# Patient Record
Sex: Male | Born: 1935 | Race: White | Hispanic: No | Marital: Married | Smoking: Former smoker
Health system: Southern US, Community
[De-identification: ages and names within clinical notes are randomized; demographics above are authoritative.]

---

## 2000-12-28 ENCOUNTER — Ambulatory Visit: Admission: RE | Admit: 2000-12-28 | Discharge: 2000-12-28 | Payer: Self-pay | Admitting: Internal Medicine

## 2000-12-28 ENCOUNTER — Encounter (INDEPENDENT_AMBULATORY_CARE_PROVIDER_SITE_OTHER): Payer: Self-pay | Admitting: Specialist

## 2005-06-22 ENCOUNTER — Ambulatory Visit: Payer: Self-pay | Admitting: Cardiology

## 2005-06-22 ENCOUNTER — Ambulatory Visit (HOSPITAL_COMMUNITY): Admission: RE | Admit: 2005-06-22 | Discharge: 2005-06-22 | Payer: Self-pay | Admitting: Cardiology

## 2005-08-30 ENCOUNTER — Ambulatory Visit: Payer: Self-pay | Admitting: Cardiology

## 2005-09-02 ENCOUNTER — Ambulatory Visit: Payer: Self-pay | Admitting: Internal Medicine

## 2006-08-28 ENCOUNTER — Ambulatory Visit: Payer: Self-pay | Admitting: Cardiology

## 2006-09-07 ENCOUNTER — Ambulatory Visit: Payer: Self-pay | Admitting: Cardiology

## 2006-09-07 ENCOUNTER — Ambulatory Visit: Payer: Self-pay

## 2006-09-07 ENCOUNTER — Encounter: Payer: Self-pay | Admitting: Cardiology

## 2006-09-13 ENCOUNTER — Ambulatory Visit: Payer: Self-pay | Admitting: Cardiology

## 2006-10-14 ENCOUNTER — Ambulatory Visit: Payer: Self-pay | Admitting: Cardiology

## 2007-01-02 ENCOUNTER — Ambulatory Visit: Payer: Self-pay | Admitting: Internal Medicine

## 2007-01-31 ENCOUNTER — Ambulatory Visit: Payer: Self-pay | Admitting: Cardiology

## 2007-03-28 ENCOUNTER — Ambulatory Visit: Payer: Self-pay | Admitting: Cardiology

## 2007-05-23 ENCOUNTER — Ambulatory Visit: Payer: Self-pay | Admitting: Cardiology

## 2007-07-18 ENCOUNTER — Ambulatory Visit: Payer: Self-pay | Admitting: Cardiology

## 2007-09-13 ENCOUNTER — Ambulatory Visit: Payer: Self-pay | Admitting: Cardiology

## 2007-11-07 ENCOUNTER — Ambulatory Visit: Payer: Self-pay | Admitting: Cardiology

## 2007-12-06 ENCOUNTER — Ambulatory Visit: Payer: Self-pay | Admitting: Cardiology

## 2008-04-25 ENCOUNTER — Ambulatory Visit: Payer: Self-pay | Admitting: Cardiology

## 2008-06-11 ENCOUNTER — Ambulatory Visit: Payer: Self-pay

## 2008-10-31 DIAGNOSIS — I10 Essential (primary) hypertension: Secondary | ICD-10-CM | POA: Insufficient documentation

## 2008-10-31 DIAGNOSIS — D869 Sarcoidosis, unspecified: Secondary | ICD-10-CM

## 2008-11-03 ENCOUNTER — Ambulatory Visit: Payer: Self-pay | Admitting: Internal Medicine

## 2008-11-03 DIAGNOSIS — R05 Cough: Secondary | ICD-10-CM

## 2008-12-15 ENCOUNTER — Ambulatory Visit: Payer: Self-pay | Admitting: Internal Medicine

## 2009-03-13 ENCOUNTER — Encounter (INDEPENDENT_AMBULATORY_CARE_PROVIDER_SITE_OTHER): Payer: Self-pay

## 2009-03-20 ENCOUNTER — Telehealth: Payer: Self-pay | Admitting: Cardiology

## 2009-04-13 DIAGNOSIS — R609 Edema, unspecified: Secondary | ICD-10-CM

## 2009-04-13 DIAGNOSIS — Z950 Presence of cardiac pacemaker: Secondary | ICD-10-CM | POA: Insufficient documentation

## 2009-04-14 ENCOUNTER — Ambulatory Visit: Payer: Self-pay | Admitting: Cardiology

## 2009-05-25 ENCOUNTER — Telehealth: Payer: Self-pay | Admitting: Cardiology

## 2009-12-23 ENCOUNTER — Ambulatory Visit: Payer: Self-pay | Admitting: Cardiology

## 2010-04-06 ENCOUNTER — Ambulatory Visit: Payer: Self-pay | Admitting: Cardiology

## 2010-06-17 ENCOUNTER — Ambulatory Visit: Payer: Self-pay | Admitting: Cardiology

## 2010-09-24 ENCOUNTER — Encounter (INDEPENDENT_AMBULATORY_CARE_PROVIDER_SITE_OTHER): Payer: Self-pay | Admitting: *Deleted

## 2010-11-01 ENCOUNTER — Encounter: Payer: Self-pay | Admitting: Cardiology

## 2010-11-01 ENCOUNTER — Ambulatory Visit: Payer: Self-pay | Admitting: Cardiology

## 2010-11-01 DIAGNOSIS — R0602 Shortness of breath: Secondary | ICD-10-CM

## 2010-11-03 ENCOUNTER — Ambulatory Visit: Payer: Self-pay | Admitting: Internal Medicine

## 2010-11-03 DIAGNOSIS — J449 Chronic obstructive pulmonary disease, unspecified: Secondary | ICD-10-CM

## 2010-11-09 ENCOUNTER — Telehealth (INDEPENDENT_AMBULATORY_CARE_PROVIDER_SITE_OTHER): Payer: Self-pay | Admitting: *Deleted

## 2010-11-10 ENCOUNTER — Ambulatory Visit: Payer: Self-pay

## 2010-11-10 ENCOUNTER — Encounter (HOSPITAL_COMMUNITY)
Admission: RE | Admit: 2010-11-10 | Discharge: 2010-12-28 | Payer: Self-pay | Source: Home / Self Care | Attending: Cardiology | Admitting: Cardiology

## 2010-11-10 ENCOUNTER — Encounter: Payer: Self-pay | Admitting: *Deleted

## 2010-11-11 ENCOUNTER — Encounter: Payer: Self-pay | Admitting: Cardiology

## 2010-12-28 NOTE — Cardiovascular Report (Signed)
Summary: TTM   TTM   Imported By: Roderic Ovens 06/11/2010 15:45:21  _____________________________________________________________________  External Attachment:    Type:   Image     Comment:   External Document

## 2010-12-28 NOTE — Assessment & Plan Note (Signed)
Summary: 6 month rov need device check also/sl   Visit Type:  Follow-up Primary Provider:  Dr. Shary Decamp  CC:  no complaints.  History of Present Illness: Patient return for followup management of his pacemaker. He had a St. Jude pacemaker implanted for AV block and had a generator change in 2006. He has been coming in for pacemaker checks every 6 months rather than following by phone. He says been doing very well with no symptoms of chest pain and shortness of breath palpitations or dizziness.  His other problem has been lower extremity edema related to venous insufficiency and possible mild diastolic heart failure. He is on Diovan with hydrochlorothiazide and this is The fluid under good control.  He is from aspirin with his primary care physician is Dr. Shary Decamp.  Current Medications (verified): 1)  Multivitamins  Tabs (Multiple Vitamin) .... Take 1 Tablet By Mouth Once A Day 2)  Aspirin Low Dose 81 Mg Tabs (Aspirin) .... Take 1 Tablet By Mouth Once A Day 3)  Aleve 220 Mg Caps (Naproxen Sodium) .... As Needed 4)  Diovan Hct 160-25 Mg Tabs (Valsartan-Hydrochlorothiazide) .Marland Kitchen.. 1 Tab Once Daily  Allergies (verified): No Known Drug Allergies  Past History:  Past Medical History: Reviewed history from 04/14/2009 and no changes required. HYPERTENSION (ICD-401.9)     - Ace cough suspected 11/03/08 d/c until December 15, 2008 > rechallenge SARCOIDOSIS (ICD-135)    - dx [facility] in 1975    - rebx 12/28/00 FOB (Wert) pos NCG    - steroid rx 2/02 > 8/02    - PFT's December 15, 2008 FEV1 1.8 (60%) ratio 62 DLCO 64 % no sign response to B2 Chronic sinusitis     - Positive right max sinusitis 12/28/00 Moderate obesity     - Target wt  <  213   for BMI < 30  1. Status post St. Jude Identity DDD pacemaker generator change in    2006 for underlying complete AV block. 2. Lower extremity edema, probably related to venous insufficiency     aggravated by Norvasc, possibly related to mild diastolic  dysfunction. 3. Hypertension. 4. Symptoms suggestive of sleep apnea.      Review of Systems       ROS is negative except as outlined in HPI.   Vital Signs:  Patient profile:   77 year old male Height:      72 inches Weight:      211 pounds Pulse rate:   60 / minute BP sitting:   108 / 61  (left arm) Cuff size:   large  Vitals Entered By: Burnett Kanaris, CNA (December 23, 2009 3:20 PM)  Physical Exam  Additional Exam:  Gen. Well-nourished, in no distress   Neck: No JVD, thyroid not enlarged, no carotid bruits Lungs: No tachypnea, clear without rales, rhonchi or wheezes Cardiovascular: Rhythm regular, PMI not displaced,  heart sounds  normal, no murmurs or gallops, no peripheral edema, pulses normal in all 4 extremities. Abdomen: BS normal, abdomen soft and non-tender without masses or organomegaly, no hepatosplenomegaly. MS: No deformities, no cyanosis or clubbing   Neuro:  No focal sns   Skin:  no lesions    PPM Specifications Following MD:  Everardo Beals. Juanda Chance, MD     PPM Vendor:  St Jude     PPM Model Number:  [id number]     PPM Serial Number:  [id number] DOI:  06/20/2005     PPM Implanting MD:  Everardo Beals. Juanda Chance, MD  Lead 1    Location: RA     DOI: 09/12/1990     Model #: 1028T     Serial #: [id number]     Status: active Lead 2    Location: RV     DOI: 09/12/1990     Model #: 1216T     Serial #: [id number]     Status: active   Indications:  CHB   PPM Follow Up Remote Check?  No Battery Voltage:  2.78 V     Battery Est. Longevity:  8.5-10 YEARS     Pacer Dependent:  Yes       PPM Device Measurements Atrium  Amplitude: 2.6 mV, Impedance: 460 ohms, Threshold: 1.25 V at 0.5 msec Right Ventricle  Amplitude: PACED AT 30 mV, Impedance: 418 ohms, Threshold: 0.5 V at 0.5 msec  Episodes MS Episodes:  3     Percent Mode Switch:  <1%     Ventricular High Rate:  NOT AVAILABLE     Atrial Pacing:  6.8%     Ventricular Pacing:  97%  Parameters Mode:  DDD     Lower Rate Limit:  55     Upper Rate  Limit:  120 Paced AV Delay:  170     Sensed AV Delay:  150 Next Cardiology Appt Due:  05/28/2010 Tech Comments:  Normal device function.  No changes made today.  All mode switch episodes less than 1 minute.  ROV 6 months. Gypsy Balsam RN BSN  December 23, 2009 3:14 PM   Impression & Recommendations:  Problem # 1:  PACEMAKER (ICD-V45.Marland Kitchen01) We interrogated his pacemaker today and he has good pacer function. He is pacing the ventricle all the time in the atrium a minority of the time. He is pacer dependent.  We will plan to have him come in for a six-month pacer check. Will arrange his yearly followup visit with Dr. Graciela Husbands in the [facility] clinic. He is agreeable to be followed with phone checks and we probably should transition into that over the next year, especially since he is pacer dependent.  Problem # 2:  EDEMA (ICD-782.3) He has a history of edema probably mostly related to venous insufficiency. This appears to be well controlled on hydrochlorothiazide and his blood pressure medications.  Patient Instructions: 1)  Your physician wants you to follow-up in: 1 year with Dr. Graciela Husbands in [place].  You will receive a reminder letter in the mail two months in advance. If you don't receive a letter, please call our office to schedule the follow-up appointment. 2)  You need a device check in 6 months with Amber/Paula

## 2010-12-28 NOTE — Assessment & Plan Note (Signed)
Summary: per check out   Visit Type:  Follow-up Primary Provider:  Dr. Shary Decamp  CC:  ROV; No Complaints.  History of Present Illness: Patient return for followup management of his pacemaker. He had a St. Jude pacemaker implanted for AV block and had a generator change in 2006. He has been coming in for pacemaker checks every 6 months rather than following by phone. He says been doing very well with no symptoms of chest pain palpitations. However his wife says that he gives out very easily and has shortness of breath with minimal exertion.   His other problem has been lower extremity edema related to venous insufficiency and possible mild diastolic heart failure. He is on Diovan with hydrochlorothiazide and this is The fluid under good control.  He is from aspirin with his primary care physician is Dr. Shary Decamp.  Current Medications (verified): 1)  Multivitamins  Tabs (Multiple Vitamin) .... Take 1 Tablet By Mouth Once A Day 2)  Aspirin Low Dose 81 Mg Tabs (Aspirin) .... Take 1 Tablet By Mouth Once A Day 3)  Aleve 220 Mg Caps (Naproxen Sodium) .... As Needed 4)  Diovan 80 Mg Tabs (Valsartan) .... One By Mouth Daily  Allergies (verified): No Known Drug Allergies  Past History:  Past Surgical History: Last updated: 04/10/2009 implanted September 12, 1990), and implantation of a new  Identity ADX DDDR pacemaker (model number [id number], serial number [id number]).  Family History: Last updated: 04/10/2009 neg asthma or resp/atopic dz Mother: 85 is alive and in good health Father: died of pulmonary fibrosis Siblings: sister alive and well  Social History: Last updated: 04/10/2009 Quit smoking 1975 Married  Retired  Lobbyist Alcohol Use - yes,  two cocktails drinks daily  Risk Factors: Smoking Status: quit (11/03/2008)  Past Medical History: Reviewed history from 04/14/2009 and no changes required. HYPERTENSION (ICD-401.9)     - Ace cough suspected 11/03/08 d/c until December 15, 2008 > rechallenge SARCOIDOSIS (ICD-135)    - dx [facility] in 1975    - rebx 12/28/00 FOB (Wert) pos NCG    - steroid rx 2/02 > 8/02    - PFT's December 15, 2008 FEV1 1.8 (60%) ratio 62 DLCO 64 % no sign response to B2 Chronic sinusitis     - Positive right max sinusitis 12/28/00 Moderate obesity     - Target wt  <  213   for BMI < 30  1. Status post St. Jude Identity DDD pacemaker generator change in    2006 for underlying complete AV block. 2. Lower extremity edema, probably related to venous insufficiency     aggravated by Norvasc, possibly related to mild diastolic     dysfunction. 3. Hypertension. 4. Symptoms suggestive of sleep apnea.      Review of Systems       ROS is negative except as outlined in HPI.   Vital Signs:  Patient profile:   77 year old male Height:      72 inches Weight:      209 pounds BMI:     28.45 Pulse rate:   72 / minute BP sitting:   128 / 62  (left arm) Cuff size:   regular  Vitals Entered By: Stanton Kidney, EMT-P (November 01, 2010 10:22 AM)  Physical Exam  Additional Exam:  Gen. Well-nourished, in no distress   Neck: No JVD, thyroid not enlarged, no carotid bruits Lungs: No tachypnea, clear without rales, rhonchi or wheezes Cardiovascular: Rhythm regular, PMI not displaced,  heart sounds  normal, no murmurs or gallops, no peripheral edema, pulses normal in all 4 extremities. Abdomen: BS normal, abdomen soft and non-tender without masses or organomegaly, no hepatosplenomegaly. MS: No deformities, no cyanosis or clubbing   Neuro:  No focal sns   Skin:  no lesions    PPM Specifications Following MD:  Everardo Beals. Juanda Chance, MD     PPM Vendor:  St Jude     PPM Model Number:  [id number]     PPM Serial Number:  [id number] PPM DOI:  06/20/2005     PPM Implanting MD:  Everardo Beals. Juanda Chance, MD  Lead 1    Location: RA     DOI: 09/12/1990     Model #: 1028T     Serial #: [id number]     Status: active Lead 2    Location: RV     DOI: 09/12/1990     Model #: 1216T     Serial  #: [id number]     Status: active   Indications:  CHB   PPM Follow Up Remote Check?  No Battery Voltage:  2.78 V     Battery Est. Longevity:  6.25     Pacer Dependent:  Yes       PPM Device Measurements Atrium  Amplitude: 1.4 mV, Impedance: 442 ohms, Threshold: 1.0 V at 0.5 msec Right Ventricle  Impedance: 427 ohms, Threshold: 0.5 V at 0.5 msec  Episodes MS Episodes:  3     Percent Mode Switch:  <1%     Coumadin:  No Atrial Pacing:  7.4%     Ventricular Pacing:  100%  Parameters Mode:  DDD     Lower Rate Limit:  55     Upper Rate Limit:  120 Paced AV Delay:  170     Sensed AV Delay:  150 Next Cardiology Appt Due:  01/17/2011 Tech Comments:  No parameter changes.  Device function normal.  3 mode switch episodes the longest 10 seconds, - coumadin.  TTM's with [employer].  ROV 01/17/11 with Dr. Graciela Husbands in [place]. Altha Harm, LPN  November 01, 2010 10:45 AM   Impression & Recommendations:  Problem # 1:  PACEMAKER, PERMANENT (ICD-V45.01) He has a St. Jude pacemaker for AV block. We interrogated him today and he is pacing the ventricle all the time. He is pacer dependent. His parameters were good.  S. preferred not to check his pacemaker by phone to check him in clinic every 6 months. He is from aspirin. We will plan with Dr. Graciela Husbands in the hospital clinic in 6 months and every 6 months. I discussed the patient with Dr. Graciela Husbands today. Orders: EKG w/ Interpretation (93000)  Problem # 2:  SHORTNESS OF BREATH (ICD-786.05) Sshortness of breath with exertion endocrine whose wife is significantly worse than previously. I'm concerned this could be an ischemic equivalent. His age sex and history of hypertension Coumadin at least moderate risk. In the right eye with a wrist rest Myoview scan. This will also give [place] an assessment of his LV function since he has been in a paced rhythm for some time. Alternative explanations for her dyspnea include his pulmonary problem with sarcoidosis by Dr. Sherene Sires and his age  and conditioning. His updated medication list for this problem includes:    Aspirin Low Dose 81 Mg Tabs (Aspirin) .Marland Kitchen... Take 1 tablet by mouth once a day    Diovan 80 Mg Tabs (Valsartan) ..... One by mouth daily  Orders: EKG w/ Interpretation (93000) Nuclear  Stress Test (Nuc Stress Test)  His updated medication list for this problem includes:    Aspirin Low Dose 81 Mg Tabs (Aspirin) .Marland Kitchen... Take 1 tablet by mouth once a day    Diovan 80 Mg Tabs (Valsartan) ..... One by mouth daily  Problem # 3:  HYPERTENSION (ICD-401.9) This is well controlled on current medications. His updated medication list for this problem includes:    Aspirin Low Dose 81 Mg Tabs (Aspirin) .Marland Kitchen... Take 1 tablet by mouth once a day    Diovan 80 Mg Tabs (Valsartan) ..... One by mouth daily  Orders: EKG w/ Interpretation (93000)  His updated medication list for this problem includes:    Aspirin Low Dose 81 Mg Tabs (Aspirin) .Marland Kitchen... Take 1 tablet by mouth once a day    Diovan 80 Mg Tabs (Valsartan) ..... One by mouth daily  Patient Instructions: 1)  Your physician has requested that you have a Lexiscan myoview.  For further information please visit [email or web address]  Please follow instruction sheet, as given. 2)  Your physician recommends that you continue on your current medications as directed. Please refer to the Current Medication list given to you today. 3)  Your physician wants you to follow-up in: 6 months with Dr. Graciela Husbands in the [facility] office for followup on your pacemaker.  You will receive a reminder letter in the mail two months in advance. If you don't receive a letter, please call our office to schedule the follow-up appointment.

## 2010-12-28 NOTE — Assessment & Plan Note (Signed)
Summary: Pulmonary/ ext f/u ov with walking sats = ok   Primary Provider/Referring Provider:  Dr. Shary Decamp  CC:  Dyspnea and cough- the same.  History of Present Illness: 57 yowm quit smoking around 1975 dx with sarcoid in 1975 and last took prednisone in 2002  November 03, 2008 back to pulmonary clinic with dry cough x 3-4 weeks,  slt more doe, noct sensation of pnds but no real mucus production asssoc with halitosis per wife. stopped ramipril and started diovan and also took round of abx   December 15, 2008 ov off ramapril doing fine in all respects though not aerobically active.> no change rx needed   November 03, 2010 ov  Dyspnea and cough- the same, pt denies it's a problem unless heavy ex like walking behind self propelled mower in the heat.   Pt denies any significant sore throat, dysphagia, itching, sneezing,  nasal congestion or excess secretions,  fever, chills, sweats, unintended wt loss, pleuritic or exertional cp, hempoptysis, change in activity tolerance  orthopnea pnd or leg swelling. Pt also denies any obvious fluctuation in symptoms with weather or environmental change or other alleviating or aggravating factors.       Current Medications (verified): 1)  Multivitamins  Tabs (Multiple Vitamin) .... Take 1 Tablet By Mouth Once A Day 2)  Aspirin Low Dose 81 Mg Tabs (Aspirin) .... Take 1 Tablet By Mouth Once A Day 3)  Aleve 220 Mg Caps (Naproxen Sodium) .... As Needed 4)  Diovan 80 Mg Tabs (Valsartan) .... One By Mouth Daily 5)  Fish Oil 1000 Mg Caps (Omega-3 Fatty Acids) .Marland Kitchen.. 1 Once Daily 6)  Vitamin D 400 Unit Caps (Cholecalciferol) .Marland Kitchen.. 1 Once Daily  Allergies (verified): No Known Drug Allergies  Past History:  Past Medical History: HYPERTENSION (ICD-401.9)     - Ace cough suspected 11/03/08 > resolved  SARCOIDOSIS (ICD-135)    - dx [facility] in 1975    - rebx 12/28/00 FOB (Wert) pos NCG    - steroid rx 2/02 > 8/02    - PFT's December 15, 2008 FEV1 1.8 (60%) ratio 62 DLCO  64 % no sign response to B2 Chronic sinusitis     - Positive right max sinusitis 12/28/00 Moderate obesity     - Target wt  <  213   for BMI < 30  1. Status post St. Jude Identity DDD pacemaker generator change in    2006 for underlying complete AV block. 2. Lower extremity edema, probably related to venous insufficiency     aggravated by Norvasc, possibly related to mild diastolic     dysfunction. 3. Hypertension. 4. Symptoms suggestive of sleep apnea.      Past Surgical History: implanted September 12, 1990), and implantation of a new  Identity ADX DDDR pacemaker (model number [id number], serial number [id number]). Bilateral cataract surgery 2009  Vital Signs:  Patient profile:   77 year old male Weight:      209 pounds O2 Sat:      96 % on Room air Temp:     97.5 degrees F oral Pulse rate:   64 / minute BP sitting:   104 / 60  (left arm)  Vitals Entered By: Vernie Murders (November 03, 2010 4:29 PM)  O2 Flow:  Room air  Serial Vital Signs/Assessments:  Comments: Ambulatory Pulse Oximetry  Resting; HR__78___    02 Sat__95%RA___  Lap1 (185 feet)   HR__96___   02 Sat__92%RA___ Lap2 (185 feet)   HR__100___  02 Sat__89%RA___    Lap3 (185 feet)   HR__102___   02 Sat__90%RA___  _x__Test Completed without Difficulty [name] Jordan CMA  November 03, 2010 4:57 PM  ___Test Stopped due to:   By: [name] Jordan CMA    Physical Exam  Additional Exam:  Pleasant amb wm nad wt 209 November 03, 2010  Neck: No JVD, thyroid not enlarged, no carotid bruits Lungs: No tachypnea, clear without rales, rhonchi or wheezes Cardiovascular: Rhythm regular, PMI not displaced,  heart sounds  normal, no murmurs or gallops, no peripheral edema, pulses normal in all 4 extremities. Abdomen: BS normal, abdomen soft and non-tender without masses or organomegaly, no hepatosplenomegaly. MS: No deformities, no cyanosis or clubbing   Neuro:  No focal sns   Skin:  no lesions    CXR  Procedure date:   11/03/2010  Findings:      Comparison: 12/15/2008.   Findings: Cardiomegaly is present.  Unchanged dual lead left subclavian cardiac pacemaker.  Bilateral  basilar fibrosis consistent with a history of sarcoidosis.  Prominence of the hilar vessels are present bilaterally, chronic.   IMPRESSION: No interval change.  Fibrotic changes at the lung bases compatible with sarcoidosis.  Impression & Recommendations:  Problem # 1:  COPD UNSPECIFIED (ICD-496) GOLD II with ex desat ? from emphysema or ILD related to burned out sarcoidosis.  He really doesn't feel limited enough at this point to be motivated to try empiric rx so ok to return for pft's .  Unlike cardiovascular dz where risk management pays off in terms of progression of dz, there's nothing to offer here other than symptom managment which is a subjective issue - in this case he doesn't want any rx because he tends to minimize his symptoms, much to the chagrin of his wife.   See instructions for specific recommendations   Medications Added to Medication List This Visit: 1)  Fish Oil 1000 Mg Caps (Omega-3 fatty acids) .Marland Kitchen.. 1 once daily 2)  Vitamin D 400 Unit Caps (Cholecalciferol) .Marland Kitchen.. 1 once daily  Other Orders: Pulse Oximetry, Ambulatory (04540) Est. Patient Level IV (98119) T-2 View CXR (71020TC)  Patient Instructions: 1)  Return to office in 3 months, sooner if needed with pfts

## 2010-12-28 NOTE — Procedures (Signed)
Summary: pc2   Current Medications (verified): 1)  Multivitamins  Tabs (Multiple Vitamin) .... Take 1 Tablet By Mouth Once A Day 2)  Aspirin Low Dose 81 Mg Tabs (Aspirin) .... Take 1 Tablet By Mouth Once A Day 3)  Aleve 220 Mg Caps (Naproxen Sodium) .... As Needed 4)  Diovan 80 Mg Tabs (Valsartan) .... One By Mouth Daily  Allergies (verified): No Known Drug Allergies   PPM Specifications Following MD:  Everardo Beals. Juanda Chance, MD     PPM Vendor:  St Jude     PPM Model Number:  [id number]     PPM Serial Number:  [id number] PPM DOI:  06/20/2005     PPM Implanting MD:  Everardo Beals. Juanda Chance, MD  Lead 1    Location: RA     DOI: 09/12/1990     Model #: 1028T     Serial #: [id number]     Status: active Lead 2    Location: RV     DOI: 09/12/1990     Model #: 1216T     Serial #: [id number]     Status: active   Indications:  CHB   PPM Follow Up Remote Check?  No Battery Voltage:  2.78 V     Battery Est. Longevity:  7 years     Pacer Dependent:  Yes       PPM Device Measurements Atrium  Amplitude: 1.9 mV, Impedance: 463 ohms, Threshold: 1.25 V at 0.5 msec Right Ventricle  Impedance: 411 ohms, Threshold: 0.625 V at 0.5 msec  Episodes MS Episodes:  6     Percent Mode Switch:  <1%     Coumadin:  No Atrial Pacing:  5.8%     Ventricular Pacing:  99%  Parameters Mode:  DDD     Lower Rate Limit:  55     Upper Rate Limit:  120 Paced AV Delay:  170     Sensed AV Delay:  150 Next Cardiology Appt Due:  10/28/2010 Tech Comments:  No parameter changes.  6 mode switch episodes the longest 12 minutes, - coumadin.  ROV 5 months with Dr. Juanda Chance. Altha Harm, LPN  June 17, 2010 11:45 AM

## 2010-12-28 NOTE — Cardiovascular Report (Signed)
Summary: Office Visit   Office Visit   Imported By: Roderic Ovens 08/27/2010 12:55:31  _____________________________________________________________________  External Attachment:    Type:   Image     Comment:   External Document

## 2010-12-28 NOTE — Miscellaneous (Signed)
Summary: dx correction  Clinical Lists Changes  Problems: Changed problem from PACEMAKER (ICD-V45.Marland Kitchen01) to PACEMAKER, PERMANENT (ICD-V45.01)  changed the incorrect dx code to correct dx code Genella Mech  September 24, 2010 12:52 PM

## 2010-12-30 NOTE — Cardiovascular Report (Signed)
Summary: Office Visit   Office Visit   Imported By: Roderic Ovens 11/11/2010 11:49:59  _____________________________________________________________________  External Attachment:    Type:   Image     Comment:   External Document

## 2010-12-30 NOTE — Assessment & Plan Note (Signed)
Summary: Cardiology Nuclear Testing  Nuclear Med Background Indications for Stress Test: Evaluation for Ischemia   History: Echo, Pacemaker  History Comments: 89 Pacemaker(CHB). '06 PTVP generator change. '07 Echo EF=NL.   Symptoms: DOE, SOB    Nuclear Pre-Procedure Cardiac Risk Factors: History of Smoking, Hypertension Caffeine/Decaff Intake: None NPO After: 6:00 PM Lungs: Clear IV 0.9% NS with Angio Cath: 22g     IV Site: R Antecubital IV Started by: Bonnita Levan, RN Chest Size (in) 46     Height (in): 72 Weight (lb): 208  Nuclear Med Study 1 or 2 day study:  1 day     Stress Test Type:  Adenosine Reading MD:  Cassell Clement, MD     Referring MD:  Charlies Constable Resting Radionuclide:  Technetium 2m Tetrofosmin     Resting Radionuclide Dose:  11.0 mCi  Stress Radionuclide:  Technetium 75m Tetrofosmin     Stress Radionuclide Dose:  33.0 mCi   Stress Protocol  Dose of Adenosine:  52.9 mg    Stress Test Technologist:  Irean Hong,  RN     Nuclear Technologist:  Doyne Keel, CNMT  Rest Procedure  Myocardial perfusion imaging was performed at rest 45 minutes following the intravenous administration of Technetium 20m Tetrofosmin.  Stress Procedure  The patient received IV adenosine at 140 mcg/kg/min for 4 minutes. The EKG was nondiagnostic due to baseline V-Pacing. Technetium 68m Tetrofosmin was injected at the 2 minute mark and quantitative spect images were obtained after a 45 minute delay.  QPS Raw Data Images:  Normal; no motion artifact; normal heart/lung ratio. Stress Images:  Normal homogeneous uptake in all areas of the myocardium. Rest Images:  Normal homogeneous uptake in all areas of the myocardium. Subtraction (SDS):  No evidence of ischemia. Transient Ischemic Dilatation:  0.99  (Normal <1.22)  Lung/Heart Ratio:  0.30  (Normal <0.45)  Quantitative Gated Spect Images QGS EDV:  120 ml QGS ESV:  55 ml QGS EF:  55 % QGS cine images:  No wall motion  abnormalities.  Findings Normal nuclear study      Overall Impression  Exercise Capacity: Adenosine study with no exercise. BP Response: Normal blood pressure response. Clinical Symptoms: No chest pain ECG Impression: V- paced  Overall Impression: Normal stress nuclear study.  Appended Document: Cardiology Nuclear Testing The pt is aware of his results.

## 2010-12-30 NOTE — Progress Notes (Signed)
Summary: nuc pre procedure  Phone Note Outgoing Call Call back at Home Phone [phone]   Call placed by: Cathlyn Parsons RN,  November 09, 2010 4:24 PM Call placed to: Patient Summary of Call: Reviewed information on Myoview Information Sheet (see scanned document for further details).  Spoke with patients wife.      Nuclear Med Background Indications for Stress Test: Evaluation for Ischemia   History: Echo  History Comments: 89 Pacemaker and generator change 06 07 Echo EF=nl   Symptoms: SOB    Nuclear Pre-Procedure Cardiac Risk Factors: History of Smoking, Hypertension Height (in): 72

## 2011-04-12 NOTE — Assessment & Plan Note (Signed)
[facility]                            CARDIOLOGY OFFICE NOTE   NAME:[name] Jordan, [name] Jordan                         MRN:          [id number]  DATE:12/06/2007                            DOB:          1931/12/16    CORRECTION:  The primary care physician should be Dr. Feliciana Rossetti and  not Dr. Modesto Charon.     [name], MD, [facility]  Electronically Signed    BRB/MedQ  DD: 12/06/2007  DT: 12/06/2007  Job #: [id number]   cc:   Feliciana Rossetti, MD

## 2011-04-12 NOTE — Assessment & Plan Note (Signed)
[name]                            CARDIOLOGY OFFICE NOTE   [name] Jordan DOWNIE                         MRN:          [id number]  DATE:12/06/2007                            DOB:          Aug 26, 1932    PRIMARY CARE PHYSICIAN:  Dr. Feliciana Jordan in [place].   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 Jude Identity generator placed for ERI.  He has done quite well  since that time from a cardiac standpoint.  He has had no recent chest  pain.  He does indicate that he has had increased swelling in his ankles  for the last several months.  He also according to his wife has some  increased shortness of breath with exertion.  He has had no chest pain.   PAST MEDICAL HISTORY:  1. Pulmonary sarcoidosis which has been treated by Dr. Sherene Jordan.  He has      been on steroids in the past but not recently.  2. He also has a history of hypertension.   REVIEW OF SYSTEMS:  His wife says he has frequent snoring and then  periods of apnea at night.  She also indicated that he falls asleep  during the daytime.   CURRENT MEDICATIONS:  1. Aspirin.  2. Norvasc 10 mg.  3. Furosemide.  4. Multivitamin.   PHYSICAL EXAMINATION:  VITAL SIGNS:  Blood pressure 157/84 and the pulse  is 86 and regular.  NECK:  The venous pulsation were questionably visible just at the  clavicle.  CHEST:  Clear without rales or rhonchi.  HEART:  Rhythm is regular.  I heard no murmurs or gallops.  ABDOMEN:  Soft with normal bowel sounds.  There is no  hepatosplenomegaly.  EXTREMITIES:  There was 2 plus edema in the left lower extremity and 1  plus edema in the right lower extremity.  Pedal pulses were equal.   An echocardiogram which was performed in October 2007, showed good LV  function.   IMPRESSION:  1. Status post St. Jude Identity DDD pacemaker generator change in      1991 for  underlying complete AV block.  2. Lower extremity edema, probably related to venous insufficiency      aggravated by Norvasc, possibly related to mild diastolic      dysfunction.  3. Hypertension.  4. Symptoms suggestive of sleep apnea.   RECOMMENDATIONS:  1. Jordan Jordan's edema, I think, is most probably related to venous      insufficiency aggravated by Norvasc.  We will increase his Lasix      from 20 to 40 a day and cut his Norvasc from 10 to 5 a day.  I will      have him follow up with Dr. Shary Decamp and if his blood pressure and      edema are well controlled on this regimen, then we can probably      persist.  If he continues to  have edema or if his blood pressure is      not well controlled, we may have to consider switching or adding      another blood pressure medicine.  2. He has symptoms strongly suggestive of sleep apnea and I think he      may need a sleep study and we will schedule this down in [place]      at his request.  3. I will check his pacemaker today and I will see him back in      followup for his pacemaker in a year.     Jordan Elvera Lennox Juanda Chance, MD, [facility]  Electronically Signed    BRB/MedQ  DD: 12/06/2007  DT: 12/06/2007  Job #: [id number]   cc:   Jordan Rossetti, MD  Jordan Jordan. [name] Sires, MD, FCCP

## 2011-04-15 NOTE — Assessment & Plan Note (Signed)
[facility]                             PULMONARY OFFICE NOTE   NAME:[name] Jordan, [name] Jordan                         MRN:          [id number]  DATE:01/02/2007                            DOB:          08-08-1932    PULMONARY/ACUTE OFFICE EVALUATION   HISTORY:  A 77 year old white male with diagnosis with sarcoid in 1975.  Last required prednisone in 2002, for symptoms of cough and dyspnea. He  comes in with new onset right anterior twinges of chest discomfort  that come and go mostly in the upright position for the last week like  somebody jabbing him.  These are not associated with an increasing  dyspnea over baseline, nor cough, no GI symptoms. He has noticed a few  sweats at night, but not directly related to the discomfort. Denies any  nausea or vomiting, true pleuritic quality of the pain (although it is  made slightly worse with coughing or sneezing if it is active). Note, he  has never had similar symptoms before because of sarcoid. Because he has  abnormal chest x-ray, he wanted to come here for evaluation.   PAST MEDICAL HISTORY:  Status post DDD pacemaker in 1991, changed in  July with a generation change in July of 2006 and also hypertension.   ALLERGIES:  None known.   MEDICATIONS:  1. Multivitamin one daily.  2. Baby aspirin one daily.   Note, he has not tried anything for the discomfort.   SOCIAL HISTORY:  He is a former smoker.   FAMILY HISTORY:  Is negative for respiratory diseases.   REVIEW OF SYSTEMS:  Taken in detail and negative except as outlined  above.   PHYSICAL EXAMINATION:  This is a pleasant, jovial, ambulatory white male  in no acute distress. He has stable vital signs.  HEENT: Is unremarkable. Oropharynx is clear.  LUNGS: Lung fields reveal a few crackles, but bases only. No cough  elicited on inspiratory maneuvers.  HEART: Regular rate and rhythm without murmur, gallop or rub present.  Chest was wall was benign with no  tenderness and abdomen was soft and  benign with no palpable organomegaly or mass or tenderness.  EXTREMITIES: Warm without calf tenderness, cyanosis, clubbing or edema.   Hemoglobin saturation is 96% on room air.   Chest x-ray showed moderate scarring bilaterally with no pleural  effusion or abnormality in the right base.   IMPRESSION:  Atypical chest discomfort that is vaguely pleuritic. New  in onset over the last week, probably musculoskeletal. He has not yet  tried even an Advil for this. I have recommended that he do so. I also  gave him the pattern of discomfort to look for that would indicate a  significant pulmonary, cardiac or hepatic etiology for the pain. I also  asked him to be on the look out for development of a rash over the same  area.   For now, I simply recommended Advil and reassurance, but would recommend  a CT scan of the chest should the discomfort worsen or be associated  with significant pulmonary symptoms.  In terms of his sarcoid, he continues to have minimum crackles on  examination with no limiting dyspnea or chronic cough and I do not  believe has any active disease at present.     Charlaine Dalton. Sherene Sires, MD, [facility]  Electronically Signed    MBW/MedQ  DD: 01/02/2007  DT: 01/02/2007  Job #: [id number]   cc:   Feliciana Rossetti, MD

## 2011-04-15 NOTE — Assessment & Plan Note (Signed)
[facility]                              CARDIOLOGY OFFICE NOTE   NAME:[name] Jordan, [name] Jordan                         MRN:          [id number]  DATE:08/28/2006                            DOB:          [birth year]    PRIMARY CARE PHYSICIAN:  Dr. Arlan Organ.   PULMONOLOGIST:  Sandrea Hughs, MD, [facility]   CLINICAL HISTORY:  [name] Jordan is 77 years old and in 1991 had a pacemaker  implanted for a complete heart block and in July 2006 had a new generator  implanted for ERI.  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  He has done quite  well since that time, has had no recent chest pain, shortness of breath or  palpitations.   PAST MEDICAL HISTORY:  Significant for pulmonary sarcoidosis, was treated by  Dr. Sherene Sires.  He had been on steroids in the past but none recently.  The main  manifestation of sarcoid was interstitial fibrosis, but Dr. Sherene Sires said in his  last note that this was somewhat atypical for sarcoidosis.   CURRENT MEDICATIONS:  Multivitamins and a baby aspirin.   EXAMINATION:  The blood pressure was 178/78 and the pulse 99 and regular.  There was no venous distention.  Carotid pulses were full without bruits.  CHEST:  Was clear.  CARDIAC:  Rhythm was regular.  Heard no murmurs or gallops.  ABDOMEN:  Was soft without organomegaly.  Peripheral pulses were full and there is no peripheral edema.   An ECG showed atrial tracking with ventricular pacing.   IMPRESSION:  1. Status post DDD pacemaker in 1991 for complete atrioventricular block      with generator change July 2006 [address] Jude Identity).  2. History of pulmonary sarcoidosis, now quiescent.  3. Hypertension.   RECOMMENDATIONS:  We will plan to interrogate [name] Jordan's pacemaker and check  his thresholds today.  His blood pressure is up and we will do a repeat  check today.  I am going to have him come back in 2 weeks for an echo to  make sure there is no sarcoidosis involvement of the heart in view that  he  had atrioventricular (AV) block in the past and a history of pulmonary  sarcoidosis.  We will get a repeat blood pressure at that time and then  decide if he needs  treatment for his blood pressure and then arrange followup with Dr. Martha Clan.  I will plan to see him back for his heart in a year.            ______________________________  Everardo Beals. Juanda Chance, MD, [facility]     BRB/MedQ  DD:  08/28/2006  DT:  08/28/2006  Job #:  [id number]   cc:   Charlaine Dalton. Sherene Sires, MD, [facility]  Arlan Organ MD

## 2011-04-15 NOTE — H&P (Signed)
[name] Jordan, [name] NO.:  [id number]   MEDICAL RECORD NO.:  [id number]          PATIENT TYPE:  OIB   LOCATION:  2899                         FACILITY:  [facility]   PHYSICIAN:  Charlies Constable, M.D. [facility] DATE OF BIRTH:  [birth year]   DATE OF ADMISSION:  06/22/2005  DATE OF DISCHARGE:                                HISTORY & PHYSICAL   ELECTROPHYSIOLOGY:  Dr. Charlies Constable   CARDIOLOGIST:  Dr. Sherlyn Lick, [facility], [place], [place].   PRIMARY CAREGIVER:  Dr. Shary Decamp   ALLERGIES:  No known drug allergies.   Mr. [name] Jordan is a 77 year old male with a history of syncope and complete heart  block with subsequent pacer implantation September 10, 1990. He has no other  cardiac history. His only medication, for example, is aspirin 81 mg daily.  He has biopsy-proven pulmonary sarcoidosis. He has marked snoring and  respiratory pauses when he is asleep per the patient's wife - this has not  yet been assessed, and his latest pacer check at Dr. Carole Civil office about 1  month ago showed that the pacemaker was at elective replacement indicator.  The patient presents today for a generator change. The patient has recently  had treatment for exacerbation of chronic bronchitis with steroids and  antibiotics. Medications include aspirin 81 mg daily and multivitamins. He  also was given an inhaler a the time of his diagnosis for bronchitis flare  and he has been tapering prednisone.   SOCIAL HISTORY:  The patient lives in [place] with his wife. He is a  retired Lobbyist. He quit smoking tobacco products 30 years ago.  He does take one to two cocktails drinks daily.   FAMILY HISTORY:  Mother at age 105 is alive and in good health. Father died  of pulmonary fibrosis. One sister alive and well.   REVIEW OF SYSTEMS:  No recent history of fevers or chills. He does have  night sweats. The patient it turns out per the patient's wife has marked  snoring with respiratory  pauses and sometimes she has to shake him awake. No  recent weight loss or gain. No adenopathy. HEENT:  No nasal discharge or  epistaxis. No voice change or hoarseness. No vertigo, no photophobia.  INTEGUMENT:  No rashes or nonhealing ulcerations to lower extremities.  CARDIOPULMONARY:  No chest pain, no shortness of breath. The patient  ambulates without complaining of dyspnea on exertion. No paroxysmal  nocturnal dyspnea. Some occasional edema lower extremities. No presyncope or  syncope since pacemaker insertion in 1991. No claudication. The patient had  recent cough with his flare of bronchitis. No history of palpitations.  UROGENITAL:  The patient had some nocturia, went four times one day last  week in the evening. NEUROPSYCHIATRIC:  No unilateral weakness or numbness,  depression, or anxiety. GASTROINTESTINAL:  The patient has occasional  heartburn for which he takes Rolaids. He has never had a GI bleed. No melena  or abdominal pain. ENDOCRINE:  No history of diabetes or thyroid problems.  MUSCULOSKELETAL:  No joint pains except at the base of the left  thumb. All  other systems are negative.   PHYSICAL EXAMINATION:  VITAL SIGNS:  Temperature 97.4, pulse is 46 and  regular, blood pressure 184/89, respirations are 20, oxygen saturation 97%.  GENERAL:  The patient is alert and oriented x3.  HEENT:  Normocephalic, atraumatic. Eyes:  Pupils equal, round, and reactive  to light. Extraocular movements are intact. Nares without discharge.  NECK:  Supple. No carotid bruits auscultated, no thyromegaly, no jugular  venous distention.  HEART:  Regular rate and rhythm which is slow, no murmur.  LUNGS:  Clear to auscultation and percussion bilaterally.  ABDOMEN:  Soft, nondistended, nontender. Bowel sounds are present. No  hepatosplenomegaly. The abdominal aorta is nonpulsatile.  EXTREMITIES:  Show no evidence of clubbing, cyanosis, or edema. Dorsalis  pedis 3/4 bilaterally and radial pulses  4/4 bilaterally.  MUSCULOSKELETAL:  No joint deformity, effusions, kyphosis, or scoliosis.  NEUROLOGIC:  Alert and oriented x3. No focal deficits noted.   LABORATORY STUDIES:  Consisting of complete blood count, basic metabolic  panel, PT/INR all underway.   ASSESSMENT:  1.  History of syncope with complete heart block status post pacemaker      insertion September 10, 1990. 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 pacer      from the [employer]. This could probably have devolved to [facility]. Jude      because [place]. Jude has a device called Electrical engineer.  2.  Pulmonary sarcoidosis.  3.  Recent bronchitis treated with antibiotics and steroids.  4.  Suspected obstructive sleep apnea.  5.  Blood pressure 184/89 on presentation here. The patient may have trouble      with hypertension that is not being corrected, but this is a one-time      observation. The patient will have a generator change today. Will      discharge the same day on 5 days of oral antibiotics - Keflex - and he      will be instructed not to get his incision wet for the next 7 days.      [name]   GM/MEDQ  D:  06/22/2005  T:  06/22/2005  Job:  [id number]

## 2011-04-15 NOTE — Cardiovascular Report (Signed)
NAME:  CUINN, WESTERHOLD NO.:  [id number]   MEDICAL RECORD NO.:  [id number]          PATIENT TYPE:  OIB   LOCATION:  2899                         FACILITY:  [facility]   PHYSICIAN:  Charlies Constable, M.D. [facility] DATE OF BIRTH:  [birth year]   DATE OF PROCEDURE:  06/22/2005  DATE OF DISCHARGE:                              CARDIAC CATHETERIZATION   INDICATIONS FOR PROCEDURE:  Mr. Mcmanamon is 77 years old and had a Paragon II  pacemaker implanted for complete heart block in 1991.  He recently reached  ERI and was brought in for a generator change.  He is pacer dependent.   PROCEDURE:  Explantation of the old Paragon II (model number 2016, serial  number [id number], implanted September 12, 1990), inspection of the atrial lead  (model number 1028T, serial number [id number], 52 cm, implanted September 12, 1990), inspection of the old ventricular lead (model number 1216T, serial  number [id number], implanted September 12, 1990), and implantation of a new  Identity ADX DDDR pacemaker (model number [id number], serial number [id number]).   ANESTHESIA:  1% local Xylocaine.   ESTIMATED BLOOD LOSS:  Less than 10 mL.   COMPLICATIONS:  None.   DESCRIPTION OF PROCEDURE:  The procedure was performed in the laboratory  room number 6.  A temporary pacemaker was passed via the right femoral vein  using a venous sheath because the patient was pacer dependent.  The left  anterior chest was prepped and draped in the usual fashion.  The skin and  subcutaneous tissue were anesthetized with 1% local Xylocaine.  An incision  was made over the pacemaker pocket and extended to the pocket.  The pocket  was opened and the generator was removed.  The leads were inspected and  tested with good parameters as described below.  The pocket was irrigated  with sterile Kanamycin solution.  The new generator was attached to the old  leads with a hex nut wrench.  The generator was implanted into the pocket  and secured loosely to the prepectoral  fascia with #1 silk.  The  subcutaneous tissue was closed with running 2-0 Dexon, the skin was closed  with running 4-0 Dexon.   Lead parameters:  Atrial P wave 8.7 millivolts, minimal insertion capture less than 1.6 volts,  resistance 660 ohms.  Ventricular bipolar R wave 11.9 millivolts, minimal insertion capture less  than 1.1 volts, resistance 805 ohms.   The patient tolerated the procedure well and left the laboratory in  satisfactory condition.       BB/MEDQ  D:  06/22/2005  T:  06/22/2005  Job:  [id number]   cc:   Harl Bowie, M.D.  [address]  [place]  [place] [address]  Fax: [phone]   Duke Salvia, M.D.

## 2011-04-15 NOTE — Op Note (Signed)
[facility]  Patient:    [name] Jordan, [name] Jordan                         MRN: [id number] Proc. Date: 12/28/00 Adm. Date:  [id number] Attending:  Avie Echevaria CC:         Modesto Charon, M.D.   Operative Report  PROCEDURE:  Bronchoscopy with transbronchial biopsy of the left lower lobe.  PULMONOLOGIST:  Charlaine Dalton. Sherene Sires, M.D.  REFERRING PHYSICIAN:  Modesto Charon, M.D.  INDICATIONS:  This is a very nice 77 year old white male who carries the diagnosis of sarcoid for the last 30 years with persistent cough and dyspnea with interstitial changes on x-ray and therefore bronchoscopy is felt to be indicated to obtain transbronchial biopsy to see if there is still activity in the interstitial changes in the bases (versus bland fibrotic change) that of course would not be sensitive to steroids).  The procedure was performed in the bronchoscopy suite after a full discussion of risks, benefits and alternatives in the office. See dictated office notes from December 11, 2000, for a comprehensive pulmonary consultation from December 20, 2000, regarding discussion of the actual procedure.  DESCRIPTION OF PROCEDURE:  Since that evaluation, there has been no change on exam.  The patient was monitored by surface ECG and oximetry and received a total of 50 mg of IV Demerol and 5 mg IV Versed for IV sedation.  The right nares was easily cannulated using a standard flexible fiberoptic bronchoscope and after using 10% lidocaine spray and 2% lidocaine jelly for topical anesthesia.  Using a standard flexible fiberoptic bronchoscope, the right nares was easily cannulated with visualization of the entire pharynx and larynx.  The cords moved normally and there were no apparent upper airway lesions.  Using an additional 1% lidocaine, the entire tracheobronchial tree was explored with the following findings.  1. Trachea, carina and all major airways opened widely on inspiration.   There    was minimal distortion of the airways but no focal endobronchial lesions    seen.  Using a fluoroscope in the wedge position in the left lower lobe, six adequate biopsies were obtained by standard transbronchial technique with no significant hemorrhage or evidence of pneumothorax.  Additionally, the lingula was lavaged.  IMPRESSION:  Sarcoidosis, ? activity level.  Await transbronchial biopsy left lower lobe.  PLAN:  The patient will be observed until he is alert and saturations are greater than 90% on room air.  His chest x-ray has been checked for alveolar hemorrhage or evidence of pneumothorax prior to discharge.  The patient will be followed as an outpatient. DD:  12/28/00 TD:  12/28/00 Job: [id number]

## 2011-12-16 ENCOUNTER — Encounter: Payer: Self-pay | Admitting: Internal Medicine

## 2012-01-10 ENCOUNTER — Telehealth: Payer: Self-pay | Admitting: Internal Medicine

## 2012-01-10 NOTE — Telephone Encounter (Signed)
12-16-11 sent past due letter/mt 01-10-12 called pt, states he is having device checked now in [place] with dr munley/mt

## 2012-07-10 ENCOUNTER — Encounter: Payer: Self-pay | Admitting: *Deleted

## 2012-07-18 ENCOUNTER — Encounter: Payer: Self-pay | Admitting: Internal Medicine

## 2012-07-18 ENCOUNTER — Ambulatory Visit (INDEPENDENT_AMBULATORY_CARE_PROVIDER_SITE_OTHER): Payer: Medicare Other | Admitting: *Deleted

## 2012-07-18 DIAGNOSIS — I442 Atrioventricular block, complete: Secondary | ICD-10-CM

## 2012-07-18 LAB — PACEMAKER DEVICE OBSERVATION
AL THRESHOLD: 1.25 V
ATRIAL PACING PM: 3
BATTERY VOLTAGE: 2.76 V
VENTRICULAR PACING PM: 98

## 2012-07-18 NOTE — Progress Notes (Signed)
PPM check 

## 2013-01-17 ENCOUNTER — Telehealth: Payer: Self-pay | Admitting: Internal Medicine

## 2013-01-17 NOTE — Telephone Encounter (Signed)
01-17-13 lmm @ 401pm for pt to set up February recall appt with klein/mt

## 2013-05-13 ENCOUNTER — Encounter: Payer: Self-pay | Admitting: Internal Medicine

## 2013-05-13 ENCOUNTER — Telehealth: Payer: Self-pay | Admitting: Internal Medicine

## 2013-05-13 NOTE — Telephone Encounter (Signed)
05-13-13 sent past due certified letter/mt

## 2013-05-29 ENCOUNTER — Ambulatory Visit (INDEPENDENT_AMBULATORY_CARE_PROVIDER_SITE_OTHER): Payer: Medicare Other | Admitting: *Deleted

## 2013-05-29 DIAGNOSIS — Z950 Presence of cardiac pacemaker: Secondary | ICD-10-CM

## 2013-05-29 DIAGNOSIS — I442 Atrioventricular block, complete: Secondary | ICD-10-CM

## 2013-05-29 LAB — PACEMAKER DEVICE OBSERVATION
AL IMPEDENCE PM: 425 Ohm
ATRIAL PACING PM: 3.4
BAMS-0001: 170 {beats}/min
BAMS-0003: 55 {beats}/min

## 2013-05-29 NOTE — Progress Notes (Signed)
Device check in clinic, normal function, see PaceArt for full details. Pt to follow up with Dr. Graciela Husbands in 6 months.   [name] Jordan 05/29/2013 12:39 PM

## 2013-06-12 ENCOUNTER — Encounter: Payer: Self-pay | Admitting: Internal Medicine

## 2013-11-06 ENCOUNTER — Encounter: Payer: Self-pay | Admitting: *Deleted

## 2013-12-17 ENCOUNTER — Ambulatory Visit (INDEPENDENT_AMBULATORY_CARE_PROVIDER_SITE_OTHER)
Admission: RE | Admit: 2013-12-17 | Discharge: 2013-12-17 | Disposition: A | Discharge status: Home / Self Care | Payer: Medicare HMO | Source: Ambulatory Visit | Attending: Internal Medicine | Admitting: Internal Medicine

## 2013-12-17 ENCOUNTER — Encounter: Payer: Self-pay | Admitting: Internal Medicine

## 2013-12-17 ENCOUNTER — Ambulatory Visit (INDEPENDENT_AMBULATORY_CARE_PROVIDER_SITE_OTHER): Payer: Medicare HMO | Admitting: Internal Medicine

## 2013-12-17 VITALS — BP 128/82 | HR 60 | Temp 97.8°F | Ht 71.0 in | Wt 213.0 lb

## 2013-12-17 DIAGNOSIS — J4489 Other specified chronic obstructive pulmonary disease: Secondary | ICD-10-CM

## 2013-12-17 DIAGNOSIS — D869 Sarcoidosis, unspecified: Secondary | ICD-10-CM

## 2013-12-17 DIAGNOSIS — J449 Chronic obstructive pulmonary disease, unspecified: Secondary | ICD-10-CM

## 2013-12-17 MED ORDER — PREDNISONE 10 MG PO TABS: ORAL_TABLET | ORAL | Status: DC

## 2013-12-17 MED ORDER — BUDESONIDE-FORMOTEROL FUMARATE 80-4.5 MCG/ACT IN AERO: INHALATION_SPRAY | RESPIRATORY_TRACT | Status: DC

## 2013-12-17 NOTE — Progress Notes (Signed)
Subjective:     Patient ID: [name] MichaelsLarry C Jordan, male   DOB: [birth year]  MRN: [id number]  HPI  [age] yowm quit smoking around 1975 dx with sarcoid in 1975 and last took prednisone chronically  in 2002   History of Present Illness  November 03, 2008 back to pulmonary clinic with dry cough x 3-4 weeks, slt more doe, noct sensation of pnds but no real mucus production asssoc with halitosis per wife. stopped ramipril and started diovan and also took round of abx  rec  D/c acei  December 15, 2008 ov off ramapril doing fine in all respects though not aerobically active.> no change rx needed      12/17/2013 acute  Ov/re-establish [name] Jordan re: ?ild/sarcoid Chief Complaint  Patient presents with  . Follow-up    Pt last seen in 2011. He he increased DOE for the past 2 months. He gets SOB when working in the yard picking up limbs and walking fast pace.    Sob bending over  Better p pred, worse off it, pattern is indolent and mildly progressive when off prednisone  No obvious day to day or daytime variabilty or assoc chronic cough or cp or chest tightness, subjective wheeze overt sinus or hb symptoms. No unusual exp hx or h/o childhood pna/ asthma or knowledge of premature birth.  Sleeping ok without nocturnal  or early am exacerbation  of respiratory  c/o's or need for noct saba. Also denies any obvious fluctuation of symptoms with weather or environmental changes or other aggravating or alleviating factors except as outlined above   Current Medications, Allergies, Complete Past Medical History, Past Surgical History, Family History, and Social History were reviewed in [facility] electronic medical record.  ROS  The following are not active complaints unless bolded sore throat, dysphagia, dental problems, itching, sneezing,  nasal congestion or excess/ purulent secretions, ear ache,   fever, chills, sweats, unintended wt loss, pleuritic or exertional cp, hemoptysis,  orthopnea pnd or leg swelling, presyncope,  palpitations, heartburn, abdominal pain, anorexia, nausea, vomiting, diarrhea  or change in bowel or urinary habits, change in stools or urine, dysuria,hematuria,  rash, arthralgias, visual complaints, headache, numbness weakness or ataxia or problems with walking or coordination,  change in mood/affect or memory.                 Past Medical History:  HYPERTENSION (ICD-401.9)  - Ace cough suspected 11/03/08 > resolved  SARCOIDOSIS (ICD-135)  - dx [facility] in 1975  - rebx 12/28/00 FOB (Tayshawn Purnell) pos NCG  - steroid rx 2/02 > 8/02  - PFT's December 15, 2008 FEV1 1.8 (60%) ratio 62 DLCO 64 % no sign response to B2  Chronic sinusitis  - Positive right max sinusitis 12/28/00  Moderate obesity  - Target wt < 213 for BMI < 30  1. Status post St. Jude Identity DDD pacemaker generator change in  2006 for underlying complete AV block.  2. Lower extremity edema, probably related to venous insufficiency  aggravated by Norvasc, possibly related to mild diastolic  dysfunction.  3. Hypertension.  4. Symptoms suggestive of sleep apnea.   Past Surgical History:  implanted September 12, 1990), and implantation of a new  Identity ADX DDDR pacemaker (model number [id number], serial number [id number]).  Bilateral cataract surgery 2009               Review of Systems     Objective:   Physical Exam    Pleasant amb wm nad   wt 209  November 03, 2010 > 12/17/2013  213   Neck: No JVD, thyroid not enlarged, no carotid bruits  Lungs: No tachypnea, clear without rales, minimal rhonchi in bases bilaterally  Cardiovascular: Rhythm regular, PMI not displaced, heart sounds normal, no murmurs or gallops, no peripheral edema, pulses normal in all 4 extremities.  Abdomen: BS normal, abdomen soft and non-tender without masses or organomegaly, no hepatosplenomegaly.  MS: No deformities, no cyanosis or clubbing  Neuro: No focal sns  Skin: no lesions   CXR  11/03/2010   Comparison: 12/15/2008.  Findings: Cardiomegaly is  present. Unchanged dual lead left  subclavian cardiac pacemaker. Bilateral basilar fibrosis  consistent with a history of sarcoidosis. Prominence of the hilar  vessels are present bilaterally, chronic.  IMPRESSION:  No interval change. Fibrotic changes at the lung bases compatible  with sarcoidosis.   CXR  12/17/2013 : Multifocal bilateral airspace disease could be due to asymmetric  edema or pneumonia.  Cardiomegaly.    Assessment:

## 2013-12-17 NOTE — Patient Instructions (Addendum)
symbicort 80 Take 2 puffs first thing in am and then another 2 puffs about 12 hours later.   Prednisone 10 mg take  4 each am x 2 days,   2 each am x 2 days,  1 each am x 2 days and stop   Please remember to go to the x-ray department downstairs for your tests - we will call you with the results when they are available.     Please schedule a follow up office visit in 2 weeks, sooner if needed

## 2013-12-18 ENCOUNTER — Telehealth: Payer: Self-pay | Admitting: Internal Medicine

## 2013-12-18 NOTE — Assessment & Plan Note (Addendum)
-   quit smoking 1975 - PFT's December 15, 2008 FEV1 1.8 (60%) ratio 62 DLCO 64 % no sign response to B2  - 12/17/2013   Walked RA x one lap @ 185 stopped due to  desat to 85%   The proper method of use, as well as anticipated side effects, of a metered-dose inhaler are discussed and demonstrated to the patient. Improved effectiveness after extensive coaching during this visit to a level of approximately  75% >  try symbicort 80 2bid samples

## 2013-12-18 NOTE — Assessment & Plan Note (Addendum)
Doubt active sarcoidosis though he does have extensive scarring in bases ? Burned out dz  Needs f/u pfts and ESR off prednisone - plans to see Cards 12/18/13 re ? Element of chf adding to ILD with which I agree

## 2013-12-18 NOTE — Telephone Encounter (Signed)
Spoke with pt. Offered appt tomorrow with MW but reports his spouse has an appt scheduled somewhere so he would not be able to make it. He will keep his scheduled appt. Nothing further needed

## 2013-12-18 NOTE — Telephone Encounter (Signed)
That's fine

## 2013-12-18 NOTE — Progress Notes (Signed)
Quick Note:  LMTCB ______ 

## 2013-12-18 NOTE — Telephone Encounter (Signed)
Pt aware of results of cxr appt moved up x 1 week-- from 12/31/13 to 12/25/13 at 10:45 with MW  Pt reports that he has already seen his Cardiologist today.  Dr Sherene SiresWert please advise if 12/25/13 appt is okay or if you want to see him sooner seeing that he has already seen Cardiologist. Thanks.

## 2013-12-25 ENCOUNTER — Encounter: Payer: Self-pay | Admitting: Internal Medicine

## 2013-12-25 ENCOUNTER — Other Ambulatory Visit (INDEPENDENT_AMBULATORY_CARE_PROVIDER_SITE_OTHER): Payer: Medicare HMO

## 2013-12-25 ENCOUNTER — Ambulatory Visit: Payer: Medicare HMO | Admitting: Internal Medicine

## 2013-12-25 ENCOUNTER — Ambulatory Visit (INDEPENDENT_AMBULATORY_CARE_PROVIDER_SITE_OTHER): Payer: Medicare HMO | Admitting: Internal Medicine

## 2013-12-25 VITALS — BP 80/50 | HR 70 | Temp 98.0°F | Ht 71.0 in | Wt 214.0 lb

## 2013-12-25 DIAGNOSIS — D869 Sarcoidosis, unspecified: Secondary | ICD-10-CM

## 2013-12-25 DIAGNOSIS — R0602 Shortness of breath: Secondary | ICD-10-CM

## 2013-12-25 DIAGNOSIS — J9610 Chronic respiratory failure, unspecified whether with hypoxia or hypercapnia: Secondary | ICD-10-CM

## 2013-12-25 DIAGNOSIS — J449 Chronic obstructive pulmonary disease, unspecified: Secondary | ICD-10-CM

## 2013-12-25 LAB — HEPATIC FUNCTION PANEL
ALBUMIN: 3.8 g/dL (ref 3.5–5.2)
ALK PHOS: 92 U/L (ref 39–117)
ALT: 69 U/L — ABNORMAL HIGH (ref 0–53)
AST: 48 U/L — AB (ref 0–37)
Bilirubin, Direct: 0.3 mg/dL (ref 0.0–0.3)
TOTAL PROTEIN: 6.3 g/dL (ref 6.0–8.3)
Total Bilirubin: 1.4 mg/dL — ABNORMAL HIGH (ref 0.3–1.2)

## 2013-12-25 LAB — BRAIN NATRIURETIC PEPTIDE: Pro B Natriuretic peptide (BNP): 937 pg/mL — ABNORMAL HIGH (ref 0.0–100.0)

## 2013-12-25 LAB — BASIC METABOLIC PANEL
BUN: 22 mg/dL (ref 6–23)
CHLORIDE: 101 meq/L (ref 96–112)
CO2: 31 mEq/L (ref 19–32)
Calcium: 8.8 mg/dL (ref 8.4–10.5)
Creatinine, Ser: 1.2 mg/dL (ref 0.4–1.5)
GFR: 61.4 mL/min (ref >60.00)
Glucose, Bld: 126 mg/dL — ABNORMAL HIGH (ref 70–99)
POTASSIUM: 4.2 meq/L (ref 3.5–5.1)
SODIUM: 139 meq/L (ref 135–145)

## 2013-12-25 LAB — CBC WITH DIFFERENTIAL/PLATELET
BASOS PCT: 0.3 % (ref 0.0–3.0)
Basophils Absolute: 0 10*3/uL (ref 0.0–0.1)
Eosinophils Absolute: 0.1 10*3/uL (ref 0.0–0.7)
Eosinophils Relative: 1.6 % (ref 0.0–5.0)
HEMATOCRIT: 47 % (ref 39.0–52.0)
HEMOGLOBIN: 15.1 g/dL (ref 13.0–17.0)
LYMPHS ABS: 1.6 10*3/uL (ref 0.7–4.0)
LYMPHS PCT: 19.3 % (ref 12.0–46.0)
MCHC: 32.2 g/dL (ref 30.0–36.0)
MCV: 97.9 fl (ref 78.0–100.0)
Monocytes Absolute: 0.6 10*3/uL (ref 0.1–1.0)
Monocytes Relative: 7.1 % (ref 3.0–12.0)
NEUTROS ABS: 5.8 10*3/uL (ref 1.4–7.7)
Neutrophils Relative %: 71.7 % (ref 43.0–77.0)
Platelets: 171 10*3/uL (ref 150.0–400.0)
RBC: 4.8 Mil/uL (ref 4.22–5.81)
RDW: 15 % — AB (ref 11.5–14.6)
WBC: 8 10*3/uL (ref 4.5–10.5)

## 2013-12-25 LAB — TSH: TSH: 1.93 u[IU]/mL (ref 0.35–5.50)

## 2013-12-25 LAB — SEDIMENTATION RATE: Sed Rate: 3 mm/hr (ref 0–22)

## 2013-12-25 LAB — ANGIOTENSIN CONVERTING ENZYME: Angiotensin-Converting Enzyme: 23 U/L (ref 8–52)

## 2013-12-25 NOTE — Progress Notes (Signed)
Subjective:     Patient ID: [name] Jordan, male   DOB: June 08, 1932  MRN: [id number]    Brief patient profile:  28 yowm quit smoking around 1975 dx with sarcoid in 1975 and last took prednisone chronically  in 2002   History of Present Illness  November 03, 2008 back to pulmonary clinic with dry cough x 3-4 weeks, slt more doe, noct sensation of pnds but no real mucus production asssoc with halitosis per wife. stopped ramipril and started diovan and also took round of abx  rec  D/c acei  December 15, 2008 ov off ramapril doing fine in all respects though not aerobically active.> no change rx needed      12/17/2013 acute  Ov/re-establish [name] Jordan re: ?ild/sarcoid Chief Complaint  Patient presents with  . Follow-up    Pt last seen in 2011. He he increased DOE for the past 2 months. He gets SOB when working in the yard picking up limbs and walking fast pace.   Sob bending over  Better p pred, worse off it, pattern is indolent and mildly progressive when off prednisone rec Symbicort 80 Take 2 puffs first thing in am and then another 2 puffs about 12 hours later.  Prednisone 10 mg take  4 each am x 2 days,   2 each am x 2 days,  1 each am x 2 days and stop  Keep appt with cars   12/25/2013 f/u ov/[name] Jordan re: ? Ild/ sarcoid vs chf (cannot recall name of cardiologist) Chief Complaint  Patient presents with  . Follow-up    Breathing is unchanged since last visit, no new co's today.      No obvious day to day or daytime variabilty or assoc chronic cough or cp or chest tightness, subjective wheeze overt sinus or hb symptoms. No unusual exp hx or h/o childhood pna/ asthma or knowledge of premature birth.  Sleeping ok without nocturnal  or early am exacerbation  of respiratory  c/o's or need for noct saba. Also denies any obvious fluctuation of symptoms with weather or environmental changes or other aggravating or alleviating factors except as outlined above   Current Medications, Allergies, Complete  Past Medical History, Past Surgical History, Family History, and Social History were reviewed in [employer] record.  ROS  The following are not active complaints unless bolded sore throat, dysphagia, dental problems, itching, sneezing,  nasal congestion or excess/ purulent secretions, ear ache,   fever, chills, sweats, unintended wt loss, pleuritic or exertional cp, hemoptysis,  orthopnea pnd or leg swelling, presyncope, palpitations, heartburn, abdominal pain, anorexia, nausea, vomiting, diarrhea  or change in bowel or urinary habits, change in stools or urine, dysuria,hematuria,  rash, arthralgias, visual complaints, headache, numbness weakness or ataxia or problems with walking or coordination,  change in mood/affect or memory.                 Past Medical History:  HYPERTENSION (ICD-401.9)  - Ace cough suspected 11/03/08 > resolved  SARCOIDOSIS (ICD-135)  - dx [facility] in 1975  - rebx 12/28/00 FOB ([name] Jordan) pos NCG  - steroid rx 2/02 > 06/2001  - PFT's December 15, 2008 FEV1 1.8 (60%) ratio 62 DLCO 64 % no sign response to B2  Chronic sinusitis  - Positive right max sinusitis 12/28/00  Moderate obesity  - Target wt < 213 for BMI < 30  1. Status post St. Jude Identity DDD pacemaker generator change in  2006 for underlying complete AV block.  2. Lower extremity edema, probably related to venous insufficiency  aggravated by Norvasc, possibly related to mild diastolic  dysfunction.  3. Hypertension.  4. Symptoms suggestive of sleep apnea.   Past Surgical History:  implanted September 12, 1990), and implantation of a new  Identity ADX DDDR pacemaker (model number [id number], serial number [id number]).  Bilateral cataract surgery 2009                     Objective:   Physical Exam    Pleasant amb wm nad   wt 209 November 03, 2010 > 12/17/2013  213 > 214 12/25/2013   Neck: No JVD, thyroid not enlarged, no carotid bruits  Lungs: No tachypnea, clear without rales,  minimal rhonchi in bases bilaterally  Cardiovascular: Rhythm regular, PMI not displaced, heart sounds normal, no murmurs or gallops, no peripheral edema, pulses normal in all 4 extremities.  Abdomen: BS normal, abdomen soft and non-tender without masses or organomegaly, no hepatosplenomegaly.  MS: No deformities, no cyanosis or clubbing  Neuro: No focal sns  Skin: no lesions   CXR  11/03/2010   Comparison: 12/15/2008.  Findings: Cardiomegaly is present. Unchanged dual lead left  subclavian cardiac pacemaker. Bilateral basilar fibrosis  consistent with a history of sarcoidosis. Prominence of the hilar  vessels are present bilaterally, chronic.  IMPRESSION:  No interval change. Fibrotic changes at the lung bases compatible  with sarcoidosis.   CXR  12/17/2013 : Multifocal bilateral airspace disease could be due to asymmetric  edema or pneumonia.  Cardiomegaly.   12/25/13 labs ok except bnp 937/  HCO3 31   Esr/ acei levels ok  Assessment:

## 2013-12-25 NOTE — Patient Instructions (Addendum)
Stop symbicort and fish oil (replace by eating salmon at least twice weekly)  Go to your heart doctor's office today and request all records from your evaluation to [phone] and I will send him my notes  Please see patient coordinator before you leave today  to schedule to arrange overnight 02 sat on room air and in meantime use the 02 at 2lpm with activities outside the house   Please remember to go to the lab  department downstairs for your tests - we will call you with the results when they are available.    Please schedule a follow up office visit in 4 weeks, sooner if needed full pfts with all active medications in hand

## 2013-12-26 ENCOUNTER — Other Ambulatory Visit: Payer: Self-pay | Admitting: Internal Medicine

## 2013-12-26 DIAGNOSIS — J9610 Chronic respiratory failure, unspecified whether with hypoxia or hypercapnia: Secondary | ICD-10-CM

## 2013-12-26 NOTE — Progress Notes (Signed)
Quick Note:  Spoke with pt and notified of results per Dr. Wert. Pt verbalized understanding and denied any questions.  ______ 

## 2013-12-27 ENCOUNTER — Telehealth: Payer: Self-pay | Admitting: Internal Medicine

## 2013-12-27 NOTE — Telephone Encounter (Signed)
Form received and placed in MW look at in his cabinet. Please advise once signed thanks

## 2013-12-27 NOTE — Assessment & Plan Note (Signed)
-   quit smoking 1975 - PFT's December 15, 2008 FEV1 1.8 (60%) ratio 62 DLCO 64 % no sign response to B2  - 12/17/2013   Walked RA x one lap @ 185 stopped due to  desat to 85% - hfa 75% p coaching 12/17/2013 > try symbicort 80 2bid samples> no better so d/c  DDX of  difficult airways managment all start with A and  include Adherence, Ace Inhibitors, Acid Reflux, Active Sinus Disease, Alpha 1 Antitripsin deficiency, Anxiety masquerading as Airways dz,  ABPA,  allergy(esp in young), Aspiration (esp in elderly), Adverse effects of DPI,  Active smokers, plus two Bs  = Bronchiectasis and Beta blocker use..and one C= CHF   Adherence is always the initial "prime suspect" and is a multilayered concern that requires a "trust but verify" approach in every patient - starting with knowing how to use medications, especially inhalers, correctly, keeping up with refills and understanding the fundamental difference between maintenance and prns vs those medications only taken for a very short course and then stopped and not refilled.  - not clear what exactly he's taking at this point so rec keep things simple > stop symbicort  ? Acid (or non-acid) GERD > always difficult to exclude as up to 75% of pts in some series report no assoc GI/ Heartburn symptoms> rec max (24h)  acid suppression and diet restrictions/ reviewed and instructions given in writing > stop fish oil  CHF appears to be the most pressing concern > need cards eval to complete the w/u > requested

## 2013-12-27 NOTE — Telephone Encounter (Signed)
Form signed and faxed to [facility]

## 2013-12-27 NOTE — Assessment & Plan Note (Addendum)
-   12/25/2013   Walked RA x one lap @ 185 stopped due to  83% > rx 2lpm and check ono RA

## 2013-12-27 NOTE — Telephone Encounter (Signed)
done

## 2013-12-27 NOTE — Assessment & Plan Note (Signed)
-   ACE level 12/25/2013  = 23  Extremely unlikely that any of his present problems are active sarcoid though he likely has burned out sarcoid related ild/ fibrosis - this would not explain his recent decline

## 2013-12-27 NOTE — Telephone Encounter (Signed)
Spoke with [name] Jordan. She reports she sent over an OCD order form for MW to sign and fax back. She will refax this to triage fax #

## 2013-12-31 ENCOUNTER — Ambulatory Visit: Payer: Medicare HMO | Admitting: Internal Medicine

## 2014-01-02 ENCOUNTER — Encounter: Payer: Self-pay | Admitting: Internal Medicine

## 2014-01-02 ENCOUNTER — Telehealth: Payer: Self-pay | Admitting: Internal Medicine

## 2014-01-02 DIAGNOSIS — J9610 Chronic respiratory failure, unspecified whether with hypoxia or hypercapnia: Secondary | ICD-10-CM

## 2014-01-02 NOTE — Telephone Encounter (Signed)
Per MW ONO pos- needs to start o2 with sleep 2lpm and recheck ONO 2lpm  ATC and advise the pt, NA and no option to leave a msg, [facility]

## 2014-01-03 NOTE — Telephone Encounter (Signed)
Pt aware of recs per MW  Pt verbalized understanding  Nothing further needed  [facility]

## 2014-01-06 ENCOUNTER — Telehealth: Payer: Self-pay | Admitting: Internal Medicine

## 2014-01-06 NOTE — Telephone Encounter (Signed)
Ed from [facility] states that pt will accept the O2 on 2/11, so Christoper Allegrapria will be able to perform the ONO on the 12th or 13th of February.  Nothing further needed.  Antionette FairyHolly D Pryor

## 2014-01-24 ENCOUNTER — Telehealth: Payer: Self-pay | Admitting: Internal Medicine

## 2014-01-24 NOTE — Telephone Encounter (Signed)
lmtcb x1 w/ spouse 

## 2014-01-27 NOTE — Telephone Encounter (Signed)
Pt has an appt on 01-29-14. Carron CurieJennifer Ren Aspinall, CMA

## 2014-01-29 ENCOUNTER — Ambulatory Visit: Payer: Medicare HMO | Admitting: Internal Medicine

## 2014-02-12 ENCOUNTER — Encounter: Payer: Self-pay | Admitting: Internal Medicine

## 2014-03-04 ENCOUNTER — Ambulatory Visit: Payer: Medicare HMO | Admitting: Internal Medicine

## 2014-03-28 ENCOUNTER — Other Ambulatory Visit: Payer: Self-pay | Admitting: Internal Medicine

## 2014-03-28 DIAGNOSIS — J449 Chronic obstructive pulmonary disease, unspecified: Secondary | ICD-10-CM

## 2014-03-31 ENCOUNTER — Encounter: Payer: Self-pay | Admitting: Internal Medicine

## 2014-03-31 ENCOUNTER — Ambulatory Visit (INDEPENDENT_AMBULATORY_CARE_PROVIDER_SITE_OTHER)
Admission: RE | Admit: 2014-03-31 | Discharge: 2014-03-31 | Disposition: A | Discharge status: Home / Self Care | Payer: Medicare HMO | Source: Ambulatory Visit | Attending: Internal Medicine | Admitting: Internal Medicine

## 2014-03-31 ENCOUNTER — Ambulatory Visit (INDEPENDENT_AMBULATORY_CARE_PROVIDER_SITE_OTHER): Payer: Medicare HMO | Admitting: Internal Medicine

## 2014-03-31 VITALS — BP 126/70 | HR 65 | Temp 98.1°F | Ht 69.0 in | Wt 200.0 lb

## 2014-03-31 DIAGNOSIS — J449 Chronic obstructive pulmonary disease, unspecified: Secondary | ICD-10-CM

## 2014-03-31 DIAGNOSIS — D869 Sarcoidosis, unspecified: Secondary | ICD-10-CM

## 2014-03-31 DIAGNOSIS — J9610 Chronic respiratory failure, unspecified whether with hypoxia or hypercapnia: Secondary | ICD-10-CM

## 2014-03-31 NOTE — Progress Notes (Signed)
Subjective:     Patient ID: [name] Jordan, male   DOB: December 11, 1931  MRN: [id number]    Brief patient profile:  61 yowm quit smoking around 1975 dx with sarcoid in 1975 and last took prednisone chronically  in 2002   History of Present Illness  November 03, 2008 back to pulmonary clinic with dry cough x 3-4 weeks, slt more doe, noct sensation of pnds but no real mucus production asssoc with halitosis per wife. stopped ramipril and started diovan and also took round of abx  rec  D/c acei  December 15, 2008 ov off ramapril doing fine in all respects though not aerobically active.> no change rx needed      12/17/2013 acute  Ov/re-establish [name] Jordan re: ?ild/sarcoid Chief Complaint  Patient presents with  . Follow-up    Pt last seen in 2011. He he increased DOE for the past 2 months. He gets SOB when working in the yard picking up limbs and walking fast pace.   Sob bending over  Better p pred, worse off it, pattern is indolent and mildly progressive when off prednisone rec Symbicort 80 Take 2 puffs first thing in am and then another 2 puffs about 12 hours later.  Prednisone 10 mg take  4 each am x 2 days,   2 each am x 2 days,  1 each am x 2 days and stop  Keep appt with cards   12/25/2013 f/u ov/[name] Jordan re: ? Ild/ sarcoid vs chf (cannot recall name of cardiologist) Chief Complaint  Patient presents with  . Follow-up    Breathing is unchanged since last visit, no new co's today.   rec Stop symbicort and fish oil (replace by eating salmon at least twice weekly) Go to your heart doctor's office today and request all records from your evaluation to [phone] and I will send him my notes Please see patient coordinator before you leave today  to schedule to arrange overnight 02 sat on room air and in meantime use the 02 at 2lpm with activities outside the house  Please schedule a follow up office visit in 4 weeks, sooner if needed full pfts with all active medications in hand   03/31/2014 f/u ov/[name] Jordan  re: copd II off all inhalers / did not return in 4 weeks or bring meds as rec Chief Complaint  Patient presents with  . Followup with PFT    Pt states that his breathing is overall doing well. No new co's today.   on lisnopril s cough and overall improved, using variable dose of lasix for leg swelling and never 02 except 2lpm at hs   No obvious day to day or daytime variabilty or assoc   cp or chest tightness, subjective wheeze overt sinus or hb symptoms. No unusual exp hx or h/o childhood pna/ asthma or knowledge of premature birth.  Sleeping ok without nocturnal  or early am exacerbation  of respiratory  c/o's or need for noct saba. Also denies any obvious fluctuation of symptoms with weather or environmental changes or other aggravating or alleviating factors except as outlined above   Current Medications, Allergies, Complete Past Medical History, Past Surgical History, Family History, and Social History were reviewed in [employer] record.  ROS  The following are not active complaints unless bolded sore throat, dysphagia, dental problems, itching, sneezing,  nasal congestion or excess/ purulent secretions, ear ache,   fever, chills, sweats, unintended wt loss, pleuritic or exertional cp, hemoptysis,  orthopnea pnd  or leg swelling, presyncope, palpitations, heartburn, abdominal pain, anorexia, nausea, vomiting, diarrhea  or change in bowel or urinary habits, change in stools or urine, dysuria,hematuria,  rash, arthralgias, visual complaints, headache, numbness weakness or ataxia or problems with walking or coordination,  change in mood/affect or memory.                 Past Medical History:  HYPERTENSION (ICD-401.9)  - Ace cough suspected 11/03/08 > resolved  SARCOIDOSIS (ICD-135)  - dx [facility] in 1975  - rebx 12/28/00 FOB ([name] Jordan) pos NCG  - steroid rx 2/02 > 06/2001  - PFT's December 15, 2008 FEV1 1.8 (60%) ratio 62 DLCO 64 % no sign response to B2  Chronic sinusitis   - Positive right max sinusitis 12/28/00  Moderate obesity  - Target wt < 213 for BMI < 30  1. Status post St. Jude Identity DDD pacemaker generator change in  2006 for underlying complete AV block.  2. Lower extremity edema, probably related to venous insufficiency  aggravated by Norvasc, possibly related to mild diastolic  dysfunction.  3. Hypertension.  4. Symptoms suggestive of sleep apnea.   Past Surgical History:  implanted September 12, 1990), and implantation of a new  Identity ADX DDDR pacemaker (model number [id number], serial number [id number]).  Bilateral cataract surgery 2009                     Objective:   Physical Exam    Pleasant amb wm nad   wt 209 November 03, 2010 > 12/17/2013  213 > 214 12/25/2013 > 03/31/2014  200  Neck: No JVD, thyroid not enlarged, no carotid bruits  Lungs: No tachypnea, clear without rales, minimal insp rhonchi in bases bilaterally  Cardiovascular: Rhythm regular, PMI not displaced, heart sounds normal, no murmurs or gallops, no peripheral edema, pulses normal in all 4 extremities.  Abdomen: BS normal, abdomen soft and non-tender without masses or organomegaly, no hepatosplenomegaly.  MS: No deformities, no cyanosis or clubbing  Neuro: No focal sns  Skin: no lesions       12/25/13 labs ok except bnp 937/  HCO3 31   Esr/ acei levels ok     CXR  03/31/2014 : 1. Patchy bilateral pulmonary infiltrates with small pleural effusions. These findings could be related to bilateral pneumonia and/or pulmonary edema.  2. Severe cardiomegaly. Cardiac pacer noted with lead tips in right atrium and right ventricle. Heart size stable from prior exam . My review: really not much change since study done 2011     Assessment:

## 2014-03-31 NOTE — Patient Instructions (Addendum)
Use 02  2lpm at bedtime and with exertion more than 250 ft  Please remember to go to the  x-ray department downstairs for your tests - we will call you with the results when they are available.    If breathing worse or develop a cough your may need to change lisinopril to any alternative and if that doesn't correct the problem return here for re-evaluation  No regular pulmonary follow up needed

## 2014-03-31 NOTE — Assessment & Plan Note (Signed)
He appears to have longstanding scarring in bases R > L dating back to at least 2011 c/w burned out sarcoid, no need for rx or f/u

## 2014-03-31 NOTE — Assessment & Plan Note (Signed)
-   quit smoking 1975 - PFT's December 15, 2008 FEV1 1.8 (60%) ratio 62 DLCO 64 % no sign response to B2  - PFT's 03/31/2014         1.45 (52%) ratio 62 and DLCO 40 with correction to 76% - 12/17/2013   Walked RA x one lap @ 185 stopped due to  desat to 85% - hfa 75% p coaching 12/17/2013 > try symbicort 80 2bid samples> no better so d/c 12/25/2013 > no worse so left off  Not clear whether airway changes are fixed from prev sarcoid or smoking but don't seem to respond to laba and really not severe so no need to continue any form of maint rx or pulmonary f/u  See instructions for specific recommendations which were reviewed directly with the patient who was given a copy with highlighter outlining the key components.

## 2014-03-31 NOTE — Progress Notes (Signed)
PFT done today. 

## 2014-03-31 NOTE — Assessment & Plan Note (Signed)
-   12/25/2013   Walked RA x one lap @ 185 stopped due to  83% > rx 2lpm  - ono RA  12/31/13  desat x 197 min >  01/02/2014 rec 02  2lpm at hs   - 03/31/2014  Walked RA  2 laps @ 185 ft each stopped due to  desat to 85%   needes to remember to wear 02 2lpm at hs and when walking more than 200 ft

## 2014-04-03 LAB — PULMONARY FUNCTION TEST
DL/VA % PRED: 76 %
DL/VA: 3.47 ml/min/mmHg/L
DLCO UNC % PRED: 40 %
DLCO unc: 12.4 ml/min/mmHg
FEF 25-75 POST: 0.94 L/s
FEF 25-75 PRE: 0.66 L/s
FEF2575-%CHANGE-POST: 42 %
FEF2575-%PRED-POST: 49 %
FEF2575-%Pred-Pre: 35 %
FEV1-%Change-Post: 10 %
FEV1-%PRED-PRE: 52 %
FEV1-%Pred-Post: 58 %
FEV1-PRE: 1.45 L
FEV1-Post: 1.6 L
FEV1FVC-%Change-Post: 8 %
FEV1FVC-%PRED-PRE: 86 %
FEV6-%Change-Post: 2 %
FEV6-%Pred-Post: 65 %
FEV6-%Pred-Pre: 63 %
FEV6-POST: 2.35 L
FEV6-Pre: 2.28 L
FEV6FVC-%Change-Post: 0 %
FEV6FVC-%Pred-Post: 106 %
FEV6FVC-%Pred-Pre: 105 %
FVC-%CHANGE-POST: 1 %
FVC-%PRED-PRE: 59 %
FVC-%Pred-Post: 61 %
FVC-Post: 2.37 L
FVC-Pre: 2.32 L
POST FEV1/FVC RATIO: 68 %
PRE FEV1/FVC RATIO: 62 %
Post FEV6/FVC ratio: 99 %
Pre FEV6/FVC Ratio: 98 %
RV % pred: 100 %
RV: 2.62 L
TLC % pred: 77 %
TLC: 5.35 L

## 2014-04-28 ENCOUNTER — Encounter: Payer: Self-pay | Admitting: Cardiology

## 2014-05-02 ENCOUNTER — Telehealth: Payer: Self-pay | Admitting: Internal Medicine

## 2014-05-02 NOTE — Telephone Encounter (Signed)
Per OV 03/31/14: Patient Instructions      Use 02  2lpm at bedtime and with exertion more than 250 ft Please remember to go to the  x-ray department downstairs for your tests - we will call you with the results when they are available. If breathing worse or develop a cough your may need to change lisinopril to any alternative and if that doesn't correct the problem return here for re-evaluation No regular pulmonary follow up needed    ---   lmtcb x1 w/ spouse

## 2014-05-05 NOTE — Telephone Encounter (Signed)
Pt advised to continue oxygen per Dr. Sherene Sires recs at last OV. I advised the pt if he wants to discuss stopping oxygen then he needs an appt. Pt states he will call back for an appt if he decides to stop oxygen.[name] Jordan, CMA

## 2015-03-23 ENCOUNTER — Ambulatory Visit (INDEPENDENT_AMBULATORY_CARE_PROVIDER_SITE_OTHER): Payer: Medicare HMO | Admitting: Internal Medicine

## 2015-03-23 ENCOUNTER — Encounter: Payer: Self-pay | Admitting: Internal Medicine

## 2015-03-23 VITALS — BP 140/98 | HR 85 | Ht 72.0 in | Wt 188.0 lb

## 2015-03-23 DIAGNOSIS — J90 Pleural effusion, not elsewhere classified: Secondary | ICD-10-CM

## 2015-03-23 DIAGNOSIS — J449 Chronic obstructive pulmonary disease, unspecified: Secondary | ICD-10-CM

## 2015-03-23 DIAGNOSIS — D869 Sarcoidosis, unspecified: Secondary | ICD-10-CM

## 2015-03-23 NOTE — Patient Instructions (Signed)
Take 2 lasix (furosemide)  daily x 2 weeks and return for cxr and office visit - call sooner if needed   I will call Dr Anders GrantGrisso's office for the results of your blood work

## 2015-03-23 NOTE — Progress Notes (Addendum)
Subjective:     Patient ID: [name] Jordan, male   DOB: July 02, 1932  MRN: [id number]    Brief patient profile:  82 yowm quit smoking around 1975 dx with sarcoid in 1975 and last took prednisone chronically  in 2002   History of Present Illness  November 03, 2008 back to pulmonary clinic with dry cough x 3-4 weeks, slt more doe, noct sensation of pnds but no real mucus production asssoc with halitosis per wife. stopped ramipril and started diovan and also took round of abx  rec  D/c acei> resolved   December 15, 2008 ov off ramapril doing fine in all respects though not aerobically active.> no change rx needed     12/17/2013 acute  Ov/re-establish Wert re: ?ild/sarcoid Chief Complaint  Patient presents with  . Follow-up    Pt last seen in 2011. He he increased DOE for the past 2 months. He gets SOB when working in the yard picking up limbs and walking fast pace.   Sob bending over  Better p pred, worse off it, pattern is indolent and mildly progressive when off prednisone rec Symbicort 80 Take 2 puffs first thing in am and then another 2 puffs about 12 hours later.  Prednisone 10 mg take  4 each am x 2 days,   2 each am x 2 days,  1 each am x 2 days and stop  Keep appt with cards> dx chf    12/25/2013 f/u ov/Wert re: ? Ild/ sarcoid vs chf (cannot recall name of cardiologist) Chief Complaint  Patient presents with  . Follow-up    Breathing is unchanged since last visit, no new co's today.   rec Stop symbicort and fish oil (replace by eating salmon at least twice weekly) Go to your heart doctor's office today and request all records from your evaluation to [phone] and I will send him my notes Please see patient coordinator before you leave today  to schedule to arrange overnight 02 sat on room air and in meantime use the 02 at 2lpm with activities outside the house  Please schedule a follow up office visit in 4 weeks, sooner if needed full pfts with all active medications in hand> did not  return   03/31/2014 f/u ov/Wert re: copd II off all inhalers / did not   bring meds as rec Chief Complaint  Patient presents with  . Followup with PFT    Pt states that his breathing is overall doing well. No new co's today.   on lisnopril s cough and overall improved, using variable dose of lasix for leg swelling and never 02 except 2lpm at hs  rec Use 02  2lpm at bedtime and with exertion more than 250 ft Please remember to go to the  x-ray department downstairs for your tests - we will call you with the results when they are available. If breathing worse or develop a cough your may need to change lisinopril to any alternative and if that doesn't correct the problem return here for re-evaluation   03/23/2015   Consultation Dr Bea Graff re  Pleural effusions/ with h/o sarcoid/ copd II   Chief Complaint  Patient presents with  . Follow-up    Pt referred back to [place] by Dr. Bea Graff. He has been losing wt and has poor appetite for the past 6 months. He c/o SOB when he bends over and with mowing the lawn. Sometimes gets dizzy when he feels SOB.    74m prior to  OV  onset indolent/ patern of doe progressive/ does not remember last cards visit or what he was told.   No obvious day to day or daytime variabilty or assoc cough  cp or chest tightness, subjective wheeze overt sinus or hb symptoms. No unusual exp hx or h/o childhood pna/ asthma or knowledge of premature birth.  Also denies any obvious fluctuation of symptoms with weather or environmental changes or other aggravating or alleviating factors except as outlined above   Current Medications, Allergies, Complete Past Medical History, Past Surgical History, Family History, and Social History were reviewed in [employer] record.  ROS  The following are not active complaints unless bolded sore throat, dysphagia, dental problems, itching, sneezing,  nasal congestion or excess/ purulent secretions, ear ache,   fever, chills,  sweats, unintended wt loss, pleuritic or exertional cp, hemoptysis,  orthopnea pnd or leg swelling, presyncope, palpitations, heartburn, abdominal pain, anorexia, nausea, vomiting, diarrhea  or change in bowel or urinary habits, change in stools or urine, dysuria,hematuria,  rash, arthralgias, visual complaints, headache, numbness weakness or ataxia or problems with walking or coordination,  change in mood/affect or memory.                 Past Medical History:  HYPERTENSION (ICD-401.9)  - Ace cough suspected 11/03/08 > resolved  SARCOIDOSIS (ICD-135)  - dx [facility] in 1975  - rebx 12/28/00 FOB (Wert) pos NCG  - steroid rx 2/02 > 06/2001  - PFT's December 15, 2008 FEV1 1.8 (60%) ratio 62 DLCO 64 % no sign response to B2  Chronic sinusitis  - Positive right max sinusitis 12/28/00  Moderate obesity  - Target wt < 213 for BMI < 30  1. Status post [employer]. Jude Identity DDD pacemaker generator change in  2006 for underlying complete AV block.  2. Lower extremity edema, probably related to venous insufficiency  aggravated by Norvasc, possibly related to mild diastolic  dysfunction.  3. Hypertension.  4. Symptoms suggestive of sleep apnea.   Past Surgical History:  implanted September 12, 1990), and implantation of a new  Identity ADX DDDR pacemaker (model number [id number], serial number [id number]).  Bilateral cataract surgery 2009                     Objective:   Physical Exam    Pleasant amb wm nad but looks thinner and less healthy/ robust than prev ov's  VS reviewed, note hbp  wt 209 November 03, 2010 > 12/17/2013  213 > 214 12/25/2013 > 03/31/2014  200 > 03/23/2015    188   Neck: No JVD, thyroid not enlarged, no carotid bruits  Lungs: No tachypnea, clear without rales, minimal insp rhonchi in bases bilaterally/ dullness in bases as well  Cardiovascular: Rhythm regular, PMI not displaced, heart sounds normal, no murmurs or gallops, Pos 1-2+ pitting bother lower ext pulses normal in all 4  extremities.  Abdomen: BS normal, abdomen soft and non-tender without masses or organomegaly, no hepatosplenomegaly.  MS: No deformities, no cyanosis or clubbing  Neuro: No focal sns  Skin: no lesions       I personally reviewed images and agree with radiology impression as follows:  CT chest  03/17/15 in canopy  Cm with bilateral R > L effusions   Labs reviewed from Dr Anders Grant office 03/13/15  Nl  Cbc , cmet . tsh and with alb 4.9 -  esr only 4        Assessment:

## 2015-03-24 ENCOUNTER — Telehealth: Payer: Self-pay | Admitting: *Deleted

## 2015-03-24 ENCOUNTER — Encounter: Payer: Self-pay | Admitting: Internal Medicine

## 2015-03-24 NOTE — Telephone Encounter (Signed)
-----   Message from Nyoka CowdenMichael B Wert, MD sent at 03/24/2015 11:14 AM EDT -----   ----- Message -----    From: Nyoka CowdenMichael B Wert, MD    Sent: 03/24/2015   9:04 AM      To: Nyoka CowdenMichael B Wert, MD  Need last labs from Dr Quay BurowGreg Grisso's office

## 2015-03-24 NOTE — Telephone Encounter (Signed)
Per MW- also needs last cards note ? [facility]  Called Dr Anders GrantGrisso's office [phone] and req labs be faxed to triage  Will await records

## 2015-03-25 ENCOUNTER — Encounter: Payer: Self-pay | Admitting: Internal Medicine

## 2015-03-25 DIAGNOSIS — J90 Pleural effusion, not elsewhere classified: Secondary | ICD-10-CM | POA: Insufficient documentation

## 2015-03-25 NOTE — Assessment & Plan Note (Signed)
-   ACE level 12/25/2013  = 23   There really is very little evidence to support active sarcoid here though he does have residual changes that make it hard to interpret his xrays which to me suggest more vol overload than anything else (see dyspnea a/p)

## 2015-03-25 NOTE — Assessment & Plan Note (Signed)
-   quit smoking 1975 - PFT's December 15, 2008 FEV1 1.8 (60%) ratio 62 DLCO 64 % no sign response to B2  - PFT's 03/31/2014         1.45 (52%) ratio 62 and DLCO 40 with correction to 76% - 12/17/2013   Walked RA x one lap @ 185 stopped due to  desat to 85% - hfa 75% p coaching 12/17/2013 > try symbicort 80 2bid samples> no better so d/c 12/25/2013 > no worse so left off  May consider trial of LAMA if not tried before but first work on vol status

## 2015-03-25 NOTE — Assessment & Plan Note (Addendum)
Initially noted ? 03/2014  And clearly worse along with pitting edema lower ext and nl tsh/ alb so most likely cardiogenic but reversible/ I have requested last cards note but discouraged to find out the pt doesn't even know his name.  rec double the lasix dose and f/u with cards ? Needs RHC/ LHC if reason for effusions not obvious  ? Add aldactone next though prefer that be done by who ever is dosing/ writing for the lasix and if Dr Hali MarryGrisso/Cards wish for me to have this followed and treated here I can certainly do this if requested but for now the fewer cooks in the kitchen the better as he seems easily confused with details of care (which is not a new problem based on my experience with him previously)

## 2015-03-30 NOTE — Telephone Encounter (Signed)
LMTCB for the pt 

## 2015-04-03 NOTE — Telephone Encounter (Signed)
lmomtcb x 2  

## 2015-04-07 ENCOUNTER — Ambulatory Visit (INDEPENDENT_AMBULATORY_CARE_PROVIDER_SITE_OTHER)
Admission: RE | Admit: 2015-04-07 | Discharge: 2015-04-07 | Disposition: A | Discharge status: Home / Self Care | Payer: Medicare HMO | Source: Ambulatory Visit | Attending: Internal Medicine | Admitting: Internal Medicine

## 2015-04-07 ENCOUNTER — Other Ambulatory Visit: Payer: Medicare HMO

## 2015-04-07 ENCOUNTER — Encounter: Payer: Self-pay | Admitting: Internal Medicine

## 2015-04-07 ENCOUNTER — Ambulatory Visit (INDEPENDENT_AMBULATORY_CARE_PROVIDER_SITE_OTHER): Payer: Medicare HMO | Admitting: Internal Medicine

## 2015-04-07 ENCOUNTER — Other Ambulatory Visit (INDEPENDENT_AMBULATORY_CARE_PROVIDER_SITE_OTHER): Payer: Medicare HMO

## 2015-04-07 VITALS — BP 118/64 | HR 85 | Ht 72.0 in | Wt 177.0 lb

## 2015-04-07 DIAGNOSIS — D869 Sarcoidosis, unspecified: Secondary | ICD-10-CM

## 2015-04-07 DIAGNOSIS — J90 Pleural effusion, not elsewhere classified: Secondary | ICD-10-CM

## 2015-04-07 DIAGNOSIS — J9611 Chronic respiratory failure with hypoxia: Secondary | ICD-10-CM | POA: Diagnosis not present

## 2015-04-07 DIAGNOSIS — R0602 Shortness of breath: Secondary | ICD-10-CM

## 2015-04-07 LAB — BASIC METABOLIC PANEL
BUN: 30 mg/dL — AB (ref 6–23)
CHLORIDE: 100 meq/L (ref 96–112)
CO2: 32 meq/L (ref 19–32)
CREATININE: 1 mg/dL (ref 0.40–1.50)
Calcium: 9.4 mg/dL (ref 8.4–10.5)
GFR: 76.26 mL/min (ref >60.00)
GLUCOSE: 151 mg/dL — AB (ref 70–99)
POTASSIUM: 4 meq/L (ref 3.5–5.1)
Sodium: 138 mEq/L (ref 135–145)

## 2015-04-07 LAB — BRAIN NATRIURETIC PEPTIDE: PRO B NATRI PEPTIDE: 590 pg/mL — AB (ref 0.0–100.0)

## 2015-04-07 NOTE — Progress Notes (Signed)
Subjective:     Patient ID: [name] Jordan, male   DOB: July 02, 1932  MRN: [id number]    Brief patient profile:  82 yowm quit smoking around 1975 dx with sarcoid in 1975 and last took prednisone chronically  in 2002   History of Present Illness  November 03, 2008 back to pulmonary clinic with dry cough x 3-4 weeks, slt more doe, noct sensation of pnds but no real mucus production asssoc with halitosis per wife. stopped ramipril and started diovan and also took round of abx  rec  D/c acei> resolved   December 15, 2008 ov off ramapril doing fine in all respects though not aerobically active.> no change rx needed     12/17/2013 acute  Ov/re-establish [name] Jordan re: ?ild/sarcoid Chief Complaint  Patient presents with  . Follow-up    Pt last seen in 2011. He he increased DOE for the past 2 months. He gets SOB when working in the yard picking up limbs and walking fast pace.   Sob bending over  Better p pred, worse off it, pattern is indolent and mildly progressive when off prednisone rec Symbicort 80 Take 2 puffs first thing in am and then another 2 puffs about 12 hours later.  Prednisone 10 mg take  4 each am x 2 days,   2 each am x 2 days,  1 each am x 2 days and stop  Keep appt with cards> dx chf    12/25/2013 f/u ov/[name] Jordan re: ? Ild/ sarcoid vs chf (cannot recall name of cardiologist) Chief Complaint  Patient presents with  . Follow-up    Breathing is unchanged since last visit, no new co's today.   rec Stop symbicort and fish oil (replace by eating salmon at least twice weekly) Go to your heart doctor's office today and request all records from your evaluation to [phone] and I will send him my notes Please see patient coordinator before you leave today  to schedule to arrange overnight 02 sat on room air and in meantime use the 02 at 2lpm with activities outside the house  Please schedule a follow up office visit in 4 weeks, sooner if needed full pfts with all active medications in hand> did not  return   03/31/2014 f/u ov/[name] Jordan re: copd II off all inhalers / did not   bring meds as rec Chief Complaint  Patient presents with  . Followup with PFT    Pt states that his breathing is overall doing well. No new co's today.   on lisnopril s cough and overall improved, using variable dose of lasix for leg swelling and never 02 except 2lpm at hs  rec Use 02  2lpm at bedtime and with exertion more than 250 ft Please remember to go to the  x-ray department downstairs for your tests - we will call you with the results when they are available. If breathing worse or develop a cough your may need to change lisinopril to any alternative and if that doesn't correct the problem return here for re-evaluation   03/23/2015   Consultation Dr Bea Graff re  Pleural effusions/ with h/o sarcoid/ copd II   Chief Complaint  Patient presents with  . Follow-up    Pt referred back to [place] by Dr. Bea Graff. He has been losing wt and has poor appetite for the past 6 months. He c/o SOB when he bends over and with mowing the lawn. Sometimes gets dizzy when he feels SOB.    74m prior to  OV  onset indolent/ pattern of doe progressive/ does not remember last cards visit or what he was told.  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 Follow-up    Pt states that his breathing is unchanged since his last visit. He c/o continuing to lose wt.    pt reports now can lie down flat and leg swelling gone s orthostatic complaints, still doe = baseline MMRC 1/2   No obvious day to day or daytime variabilty or assoc cough  cp or chest tightness, subjective wheeze overt sinus or hb symptoms. No unusual exp hx or h/o childhood pna/ asthma or knowledge of premature birth.  Also denies any obvious  fluctuation of symptoms with weather or environmental changes or other aggravating or alleviating factors except as outlined above   Current Medications, Allergies, Complete Past Medical History, Past Surgical History, Family History, and Social History [facility].  ROS  The following are not active complaints unless bolded sore throat, dysphagia, dental problems, itching, sneezing,  nasal congestion or excess/ purulent secretions, ear ache,   fever, chills, sweats, unintended wt loss, pleuritic or exertional cp, hemoptysis,  orthopnea pnd or leg swelling, presyncope, palpitations, heartburn, abdominal pain, anorexia, nausea, vomiting, diarrhea  or change in bowel or urinary habits, change in stools or urine, dysuria,hematuria,  rash, arthralgias, visual complaints, headache, numbness weakness or ataxia or problems with walking or coordination,  change in mood/affect or memory.                 Past Medical History:  HYPERTENSION (ICD-401.9)  - Ace cough suspected 11/03/08 > resolved  SARCOIDOSIS (ICD-135)  - dx [facility] in 1975  - rebx 12/28/00 FOB (Jermanie Minshall) pos NCG  - steroid rx 2/02 > 06/2001  - PFT's December 15, 2008 FEV1 1.8 (60%) ratio 62 DLCO 64 % no sign response to B2  Chronic sinusitis  - Positive right max sinusitis 12/28/00  Moderate obesity  - Target wt < 213 for BMI < 30  1. Status post St. Jude Identity DDD pacemaker generator change in  2006 for underlying complete AV block.  2. Lower extremity edema, probably related to venous insufficiency  aggravated by Norvasc, possibly related to mild diastolic  dysfunction.  3. Hypertension.  4. Symptoms suggestive of sleep apnea.   Past Surgical History:  implanted September 12, 1990,   and implantation of a new  Identity ADX DDDR pacemaker (model number [id number], serial number [id number]).  Bilateral cataract surgery 2009                     Objective:   Physical Exam    Pleasant amb  wm nad but looks thinner and less healthy/ robust than prev ov's  VS reviewed, note hbp  wt 209 November 03, 2010 > 12/17/2013  213 > 214 12/25/2013 > 03/31/2014  200 > 03/23/2015    188 > 04/07/2015 177   Neck: No JVD, thyroid not enlarged, no carotid bruits  Lungs: No tachypnea, clear without rales, rhonchi Cardiovascular: Rhythm regular, PMI not displaced, heart sounds normal, no murmurs or gallops, no pitting now Abdomen: BS normal, abdomen soft and non-tender without masses or  organomegaly, no hepatosplenomegaly.  MS: No deformities, no cyanosis or clubbing  Neuro: No focal sns  Skin: no lesions       I personally reviewed images and agree with radiology impression as follows:  CxR 04/07/15 Improved aeration. Small pleural effusions remain. Probable chronic change at the lung bases consistent with scarring but cannot exclude residual pneumonia    Labs ordered/ reviewed:   Lab 04/07/15 1618  NA 138  K 4.0  CL 100  CO2 32  BUN 30*  CREATININE 1.00  GLUCOSE 151*     Lab Results  Component Value Date   PROBNP 590.0* 04/07/2015           Assessment:

## 2015-04-07 NOTE — Patient Instructions (Signed)
Please remember to go to the lab  department downstairs for your tests - we will call you with the results when they are available.  We will request Dr Lowella CurbKrasowki's last office notes and send him a copy of this note when the labs are back but you should let him adjust your lasix going forward  Pulmonary follow up is if your breathing gets worse in the absence of obvious swelling of your legs

## 2015-04-08 ENCOUNTER — Encounter: Payer: Self-pay | Admitting: Internal Medicine

## 2015-04-08 NOTE — Progress Notes (Signed)
Quick Note:  Spoke with pt and notified of results per Dr. Wert. Pt verbalized understanding and denied any questions.  ______ 

## 2015-04-08 NOTE — Assessment & Plan Note (Signed)
-   12/25/2013   Walked RA x one lap @ 185 stopped due to  83% > rx 2lpm  - ono RA  12/31/13  desat x 197 min >  01/02/2014 rec 02  2lpm at hs   - 03/31/2014  Walked RA  2 laps @ 185 ft each stopped due to  desat to 85% > refuses amb 02   Most likely this is due to residual PF from burned out sarcoid, nothing more to offer as refuses to use amb 02

## 2015-04-08 NOTE — Telephone Encounter (Signed)
Request for recs faxed to Dr Bing MatterKrasowski

## 2015-04-08 NOTE — Assessment & Plan Note (Addendum)
CT chest  03/17/15 in canopy  Cm with bilateral R > L effusions  Labs reviewed from Dr Willette Pa office 03/13/15 Nl  Cbc , cmet . tsh and with alb 4.9 -  esr only 4 - cards eval requested 03/25/2015 > have not received as of 04/08/2015    I had an extended final summary discussion with the patient reviewing all relevant studies completed to date and  lasting 15 to 20 minutes of a 25 minute visit on the following issues:    Clearly better with diuresis with resolution of edema, orthopnea and effusions on lasix 80 mg daily with bnp still elevated post diuresis (do not know what it was previously)  c/w chf  with further dosing deferred to Dr Bea Graff and cards  Each maintenance medication was reviewed in detail including most importantly the difference between maintenance and as needed and under what circumstances the prns are to be used.  Please see instructions for details which were reviewed in writing and the patient given a copy.

## 2015-04-08 NOTE — Assessment & Plan Note (Signed)
No evidence of active dz/ no f/u needed

## 2016-01-12 IMAGING — CR DG CHEST 2V
2 series · 2 of 2 positions shown · non-contrast
Comparison: Chest x-ray of 01/27/2015 and 01/29/2015

CLINICAL DATA: Weight loss of 15-20 lb in 5 months, history of
sarcoidosis, former smoking history

EXAM:
CHEST  2 VIEW

[view not recorded (1 of 2)]
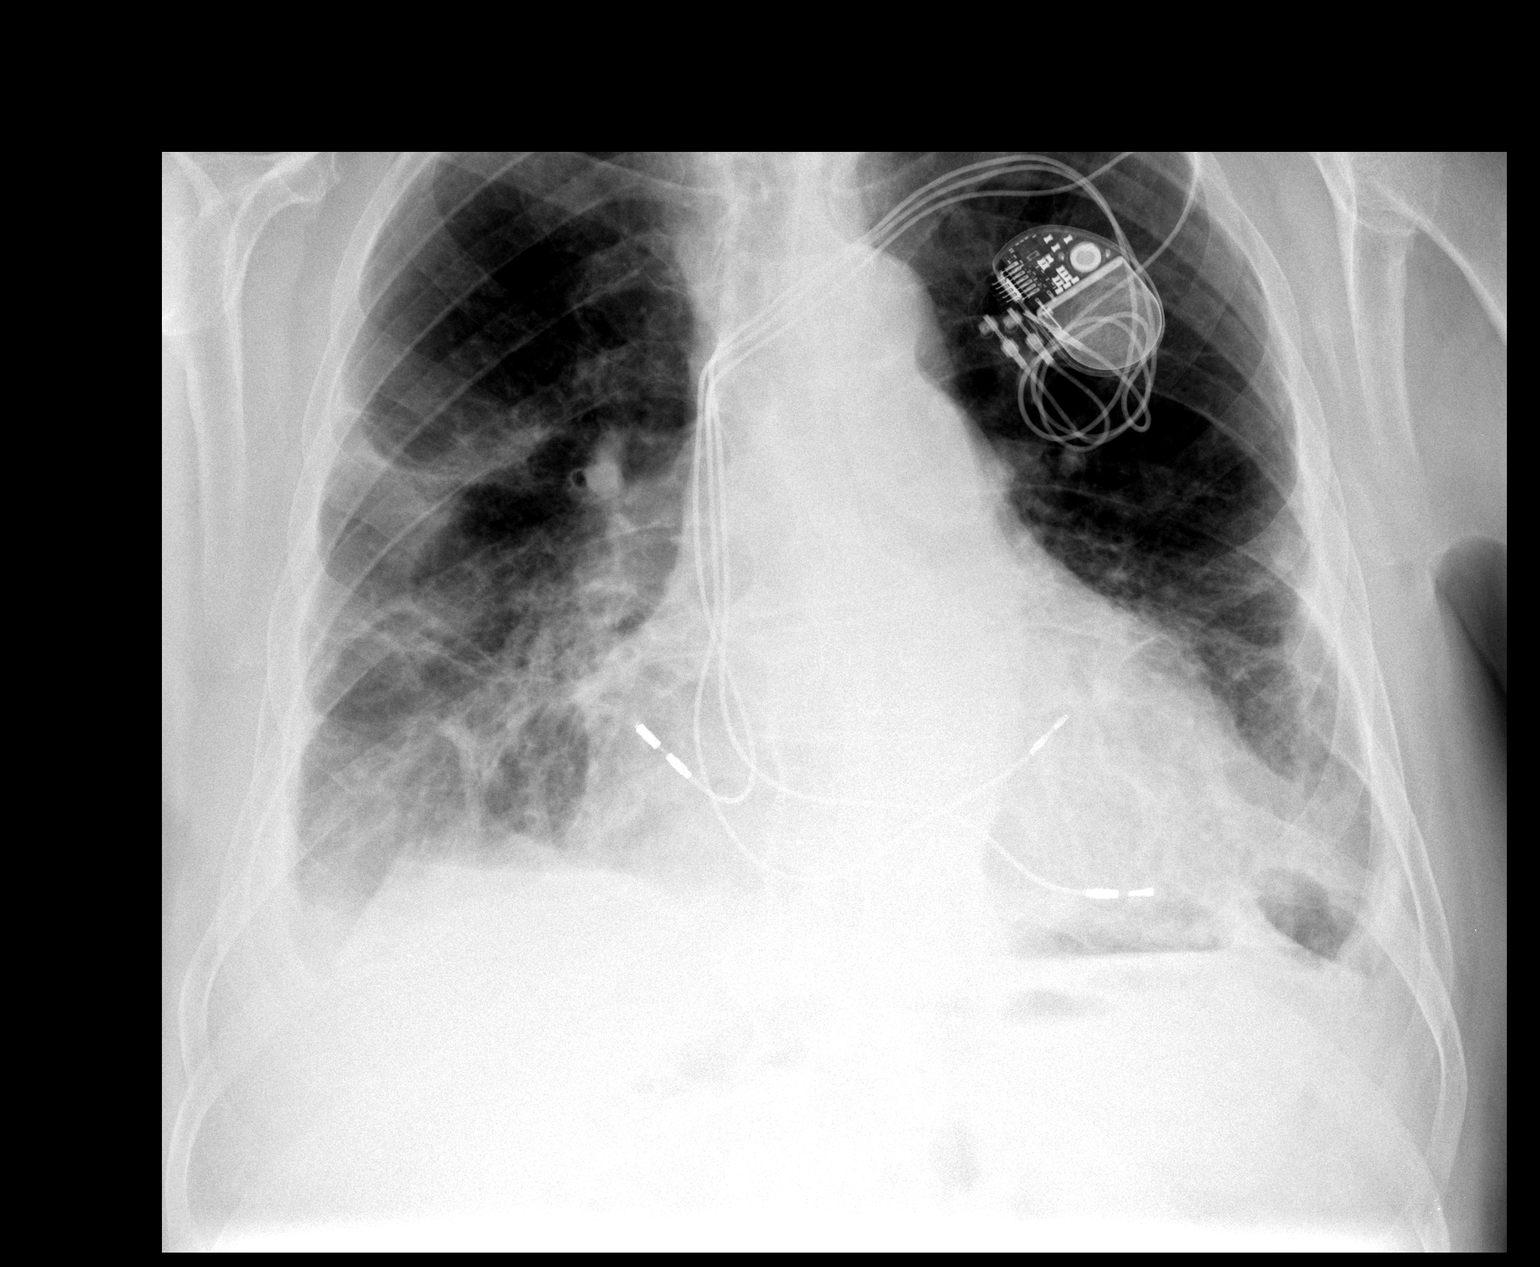

[view not recorded (2 of 2)]
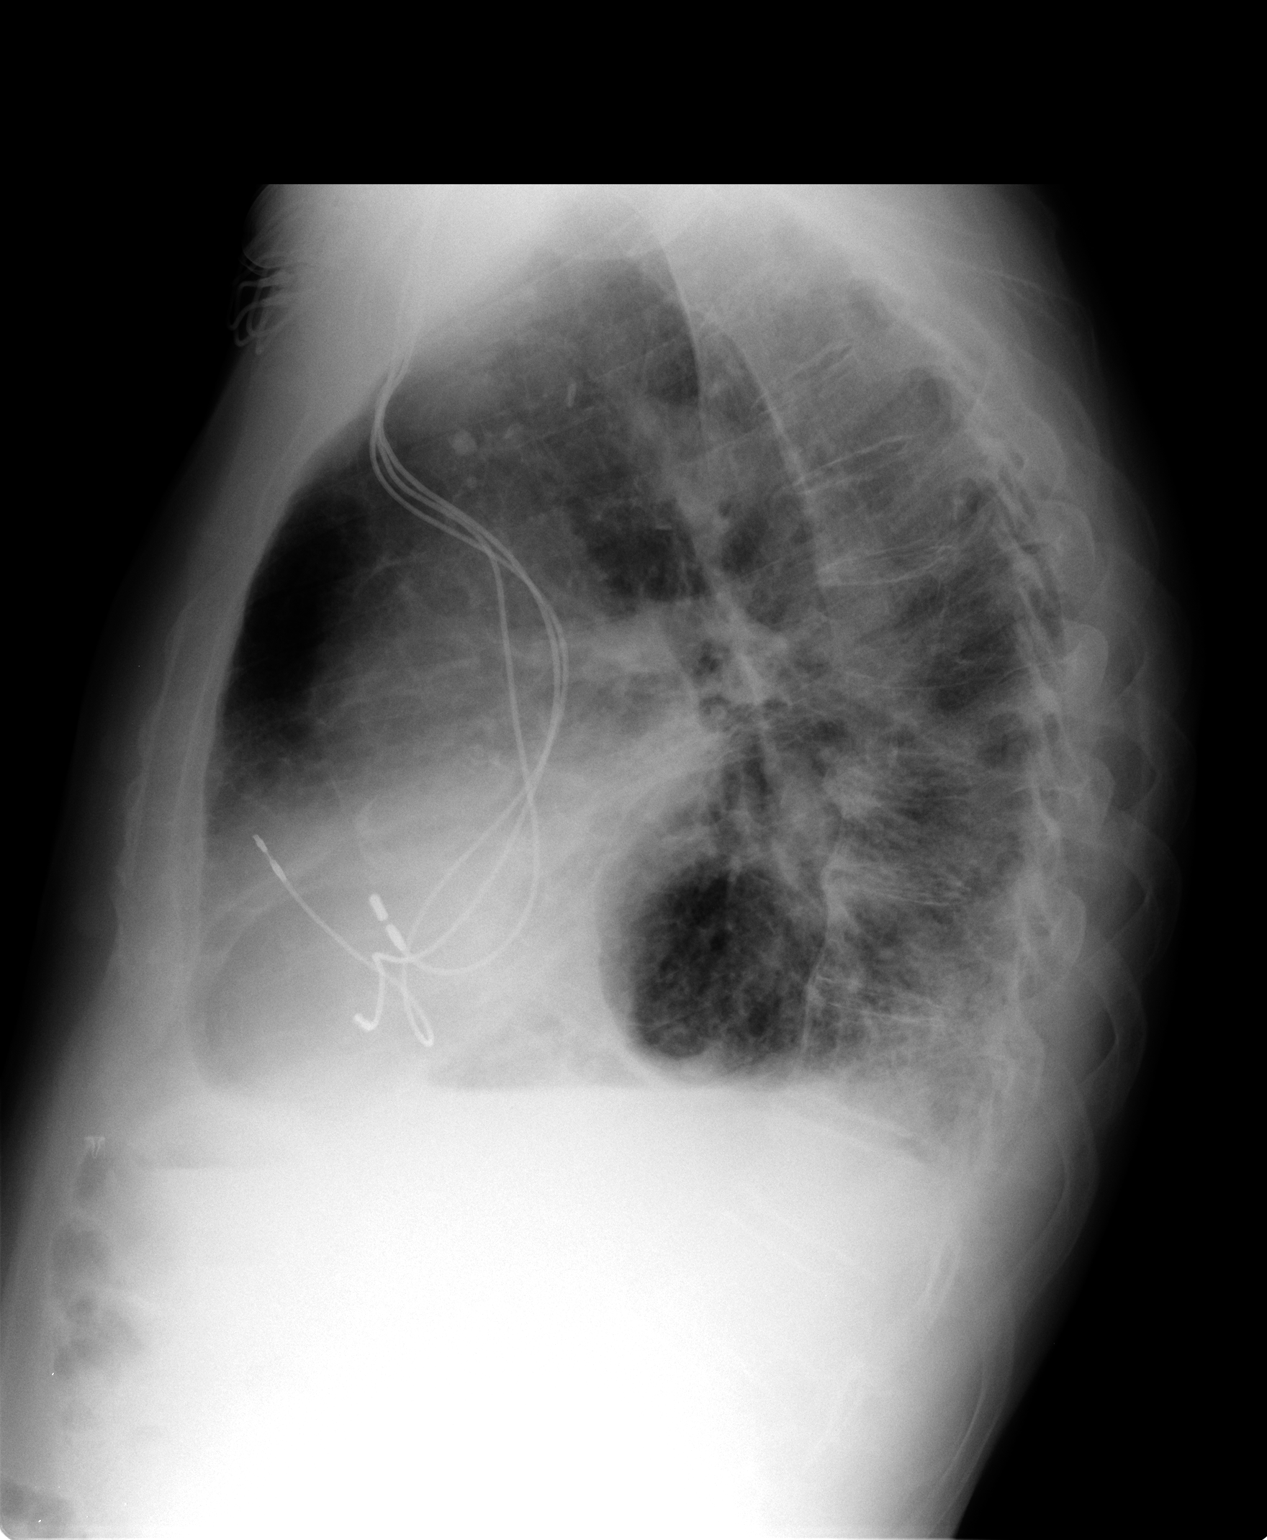

[2 of 2 positions shown; findings below may reference images not displayed]

FINDINGS: Aeration of the lungs has improved. There do still appear to be
small pleural effusions present. Prominent interstitial markings
remain at the lung bases some of which are chronic in this patient
with sarcoidosis. Residual pneumonia cannot be excluded.
Cardiomegaly is stable and a permanent pacemaker remains. No acute
bony abnormality is seen.
IMPRESSION: Improved aeration. Small pleural effusions remain. Probable chronic
change at the lung bases consistent with scarring but cannot exclude
residual pneumonia.

## 2016-06-03 ENCOUNTER — Ambulatory Visit (INDEPENDENT_AMBULATORY_CARE_PROVIDER_SITE_OTHER)
Admission: RE | Admit: 2016-06-03 | Discharge: 2016-06-03 | Disposition: A | Discharge status: Home / Self Care | Payer: Medicare HMO | Source: Ambulatory Visit | Attending: Internal Medicine | Admitting: Internal Medicine

## 2016-06-03 ENCOUNTER — Encounter: Payer: Self-pay | Admitting: Internal Medicine

## 2016-06-03 ENCOUNTER — Ambulatory Visit (INDEPENDENT_AMBULATORY_CARE_PROVIDER_SITE_OTHER): Payer: Medicare HMO | Admitting: Internal Medicine

## 2016-06-03 VITALS — BP 90/60 | HR 67 | Ht 69.0 in | Wt 166.0 lb

## 2016-06-03 DIAGNOSIS — D869 Sarcoidosis, unspecified: Secondary | ICD-10-CM

## 2016-06-03 DIAGNOSIS — J9611 Chronic respiratory failure with hypoxia: Secondary | ICD-10-CM | POA: Diagnosis not present

## 2016-06-03 DIAGNOSIS — J90 Pleural effusion, not elsewhere classified: Secondary | ICD-10-CM

## 2016-06-03 NOTE — Patient Instructions (Signed)
Please remember to go to the  x-ray department downstairs for your tests - we will call you with the results when they are available.  If your overall condition worsens consider starting prednisone 10 mg daily until better then maintin on 5 mg daily   Return here for worse breathing or coughing

## 2016-06-03 NOTE — Progress Notes (Signed)
Subjective:     Patient ID: [name] Jordan, male   DOB: July 02, 1932  MRN: [id number]    Brief patient profile:  82 yowm quit smoking around 1975 dx with sarcoid in 1975 and last took prednisone chronically  in 2002   History of Present Illness  November 03, 2008 back to pulmonary clinic with dry cough x 3-4 weeks, slt more doe, noct sensation of pnds but no real mucus production asssoc with halitosis per wife. stopped ramipril and started diovan and also took round of abx  rec  D/c acei> resolved   December 15, 2008 ov off ramapril doing fine in all respects though not aerobically active.> no change rx needed     12/17/2013 acute  Ov/re-establish [name] Jordan re: ?ild/sarcoid Chief Complaint  Patient presents with  . Follow-up    Pt last seen in 2011. He he increased DOE for the past 2 months. He gets SOB when working in the yard picking up limbs and walking fast pace.   Sob bending over  Better p pred, worse off it, pattern is indolent and mildly progressive when off prednisone rec Symbicort 80 Take 2 puffs first thing in am and then another 2 puffs about 12 hours later.  Prednisone 10 mg take  4 each am x 2 days,   2 each am x 2 days,  1 each am x 2 days and stop  Keep appt with cards> dx chf    12/25/2013 f/u ov/[name] Jordan re: ? Ild/ sarcoid vs chf (cannot recall name of cardiologist) Chief Complaint  Patient presents with  . Follow-up    Breathing is unchanged since last visit, no new co's today.   rec Stop symbicort and fish oil (replace by eating salmon at least twice weekly) Go to your heart doctor's office today and request all records from your evaluation to [phone] and I will send him my notes Please see patient coordinator before you leave today  to schedule to arrange overnight 02 sat on room air and in meantime use the 02 at 2lpm with activities outside the house  Please schedule a follow up office visit in 4 weeks, sooner if needed full pfts with all active medications in hand> did not  return   03/31/2014 f/u ov/[name] Jordan re: copd II off all inhalers / did not   bring meds as rec Chief Complaint  Patient presents with  . Followup with PFT    Pt states that his breathing is overall doing well. No new co's today.   on lisnopril s cough and overall improved, using variable dose of lasix for leg swelling and never 02 except 2lpm at hs  rec Use 02  2lpm at bedtime and with exertion more than 250 ft Please remember to go to the  x-ray department downstairs for your tests - we will call you with the results when they are available. If breathing worse or develop a cough your may need to change lisinopril to any alternative and if that doesn't correct the problem return here for re-evaluation   03/23/2015   Consultation Dr Bea Graff re  Pleural effusions/ with h/o sarcoid/ copd II   Chief Complaint  Patient presents with  . Follow-up    Pt referred back to [place] by Dr. Bea Graff. He has been losing wt and has poor appetite for the past 6 months. He c/o SOB when he bends over and with mowing the lawn. Sometimes gets dizzy when he feels SOB.    74m prior to  OV  onset indolent/ pattern of doe progressive/ does not remember last cards visit or what he was told.  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 Follow-up    Pt states that his breathing is unchanged since his last visit. He c/o continuing to lose wt.    pt reports now can lie down flat and leg swelling gone s orthostatic complaints, still doe = baseline MMRC 1/2  rec Please remember to go to the lab  department downstairs for your tests - we will call you with the results when they are available. We will request Dr Lowella CurbKrasowki's last office notes and send him a copy of this note when the labs are  back but you should let him adjust your lasix going forward Pulmonary follow up is if your breathing gets worse in the absence of obvious swelling of your legs      06/03/2016  f/u ov/[name] Jordan re: sarcoidosis/ abn cxr on 02 2lpm hs  Chief Complaint  Patient presents with  . Pulmonary Consult    Referred by Dr. Feliciana RossettiGreg Grisso for eval of abormal ct chest. He has been losing weight unintentionally.   Not limited by breathing from desired activities   / no cough or cp Admits depressed re wife's illness and she is no longer cooking so he's no longer eating well / poor appetite   No obvious day to day or daytime variabilty or assoc cough  cp or chest tightness, subjective wheeze overt sinus or hb symptoms. No unusual exp hx or h/o childhood pna/ asthma or knowledge of premature birth.  Also denies any obvious fluctuation of symptoms with weather or environmental changes or other aggravating or alleviating factors except as outlined above   Current Medications, Allergies, Complete Past Medical History, Past Surgical History, Family History, and Social History were reviewed in [facility] electronic medical record.  ROS  The following are not active complaints unless bolded sore throat, dysphagia, dental problems, itching, sneezing,  nasal congestion or excess/ purulent secretions, ear ache,   fever, chills, sweats, unintended wt loss, pleuritic or exertional cp, hemoptysis,  orthopnea pnd or leg swelling, presyncope, palpitations, heartburn, abdominal pain, anorexia, nausea, vomiting, diarrhea  or change in bowel or urinary habits, change in stools or urine, dysuria,hematuria,  rash, arthralgias, visual complaints, headache, numbness weakness or ataxia or problems with walking or coordination,  change in mood/affect or memory.                 Past Medical History:  HYPERTENSION (ICD-401.9)  - Ace cough suspected 11/03/08 > resolved  SARCOIDOSIS (ICD-135)  - dx [facility] in 1975  - rebx 12/28/00 FOB  (Keyna Blizard) pos NCG  - steroid rx 2/02 > 06/2001  - PFT's December 15, 2008 FEV1 1.8 (60%) ratio 62 DLCO 64 % no sign response to B2  Chronic sinusitis  - Positive right max sinusitis 12/28/00  Moderate obesity  - Target wt < 213 for BMI < 30  1. Status post [employer] Identity DDD pacemaker generator change in  2006 for underlying complete AV block.  2. Lower extremity edema, probably related to venous insufficiency  aggravated by Norvasc, possibly related to mild diastolic  dysfunction.  3. Hypertension.  4. Symptoms suggestive of sleep apnea.     Past Surgical History:  implanted September 12, 1990, and implantation of a new  Identity ADX DDDR pacemaker (model number [id number], serial number [id number]).  Bilateral cataract surgery 2009                     Objective:   Physical Exam    Pleasant amb wm nad but looks chronically ill/ somewhat emaciated   VS reviewed-    wt 209 November 03, 2010 > 12/17/2013  213 > 214 12/25/2013 > 03/31/2014  200 > 03/23/2015    188 > 04/07/2015 177 > 06/04/2016 166   Neck: No JVD, thyroid not enlarged, no carotid bruits  Lungs: No tachypnea, bilateral insp / exp rhonchi s localized wheeze or rales  Cardiovascular: Rhythm regular, PMI not displaced, heart sounds normal, no murmurs or gallops, R> L  pitting edema both ankles Abdomen: BS normal, abdomen soft and non-tender without masses or organomegaly, no hepatosplenomegaly.  MS: No deformities, no cyanosis or clubbing  Neuro: No focal sns  Skin: no lesions     CXR PA and Lateral:   06/03/2016 :    I personally reviewed images and agree with radiology impression as follows:   Chronic stable changes consistent with known sarcoidosis and the patient's smoking history. No acute cardiopulmonary abnormality is observed.         Assessment:

## 2016-06-03 NOTE — Progress Notes (Signed)
Quick Note:  LMTCB ______ 

## 2016-06-04 NOTE — Assessment & Plan Note (Signed)
This problem has improved on rx by cards despite persistent peripheral edema on diovan/lasix > he is borderline orthostatic now > cards f/u planned

## 2016-06-04 NOTE — Assessment & Plan Note (Signed)
-   ACE level 12/25/2013  = 23  - CT chest 05/25/16 vs 03/17/15 no changes in coarse int pattern,brochiectasis and scarring both lower lobes  He appears to me to have burned out sarcoid s active cough or limiting sob despite a bad looking scan which is stable x > 1 year.  I might consider low dose steroid challenge here just because it's so hard to be sure there's no active dz somewhere to explain the wt loss, though he attibutes this to his wife's illness.   If no other explanation, would try 10 mg per day x several weeks using the "lowest dose that works is the right dose" and understanding that any active granulmatous inflammation should improve on even that dose.  If improves at all would reduce to 5 mg daily thereafter.  I had an extended discussion with the patient reviewing all relevant studies completed to date and  lasting 15 to 20 minutes of a 25 minute visit    Each maintenance medication was reviewed in detail including most importantly the difference between maintenance and prns and under what circumstances the prns are to be triggered using an action plan format that is not reflected in the computer generated alphabetically organized AVS.    Please see instructions for details which were reviewed in writing and the patient given a copy highlighting the part that I personally wrote and discussed at today's ov.

## 2016-06-06 ENCOUNTER — Telehealth: Payer: Self-pay | Admitting: Internal Medicine

## 2016-06-06 NOTE — Telephone Encounter (Signed)
Notes Recorded by Nyoka CowdenMichael B Wert, MD on 06/03/2016 at 4:54 PM Call pt: Reviewed cxr and no acute change so [facility]  Called spoke with the patient. Reviewed results and recs. Pt voiced understanding and had no further questions.

## 2016-06-06 NOTE — Progress Notes (Signed)
Quick Note:  Called spoke with pt. Reviewed results and recs. Pt voiced understanding and had no further questions. ______ 

## 2016-06-20 ENCOUNTER — Encounter: Payer: Self-pay | Admitting: Internal Medicine

## 2016-07-29 DEATH — deceased
